# Patient Record
Sex: Female | Born: 1945 | Race: White | Hispanic: No | Marital: Married | Smoking: Never smoker
Health system: Southern US, Community
[De-identification: ages and names within clinical notes are randomized; demographics above are authoritative.]

## PROBLEM LIST (undated history)

## (undated) DIAGNOSIS — M8580 Other specified disorders of bone density and structure, unspecified site: Secondary | ICD-10-CM

## (undated) DIAGNOSIS — Z9889 Other specified postprocedural states: Secondary | ICD-10-CM

## (undated) DIAGNOSIS — S0300XA Dislocation of jaw, unspecified side, initial encounter: Secondary | ICD-10-CM

## (undated) DIAGNOSIS — R112 Nausea with vomiting, unspecified: Secondary | ICD-10-CM

## (undated) DIAGNOSIS — I10 Essential (primary) hypertension: Secondary | ICD-10-CM

## (undated) DIAGNOSIS — K5790 Diverticulosis of intestine, part unspecified, without perforation or abscess without bleeding: Secondary | ICD-10-CM

## (undated) DIAGNOSIS — K219 Gastro-esophageal reflux disease without esophagitis: Secondary | ICD-10-CM

## (undated) DIAGNOSIS — G629 Polyneuropathy, unspecified: Secondary | ICD-10-CM

## (undated) DIAGNOSIS — D126 Benign neoplasm of colon, unspecified: Secondary | ICD-10-CM

## (undated) DIAGNOSIS — H9190 Unspecified hearing loss, unspecified ear: Secondary | ICD-10-CM

## (undated) DIAGNOSIS — Z9289 Personal history of other medical treatment: Secondary | ICD-10-CM

## (undated) DIAGNOSIS — M5090 Cervical disc disorder, unspecified, unspecified cervical region: Secondary | ICD-10-CM

## (undated) DIAGNOSIS — F419 Anxiety disorder, unspecified: Secondary | ICD-10-CM

## (undated) DIAGNOSIS — E042 Nontoxic multinodular goiter: Secondary | ICD-10-CM

## (undated) DIAGNOSIS — I8390 Asymptomatic varicose veins of unspecified lower extremity: Secondary | ICD-10-CM

## (undated) DIAGNOSIS — N809 Endometriosis, unspecified: Secondary | ICD-10-CM

## (undated) DIAGNOSIS — C801 Malignant (primary) neoplasm, unspecified: Secondary | ICD-10-CM

## (undated) DIAGNOSIS — M1990 Unspecified osteoarthritis, unspecified site: Secondary | ICD-10-CM

## (undated) HISTORY — DX: Benign neoplasm of colon, unspecified: D12.6

## (undated) HISTORY — DX: Unspecified osteoarthritis, unspecified site: M19.90

## (undated) HISTORY — DX: Polyneuropathy, unspecified: G62.9

## (undated) HISTORY — PX: TONSILLECTOMY AND ADENOIDECTOMY: SHX28

## (undated) HISTORY — PX: CATARACT EXTRACTION: SUR2

## (undated) HISTORY — DX: Cervical disc disorder, unspecified, unspecified cervical region: M50.90

## (undated) HISTORY — DX: Gastro-esophageal reflux disease without esophagitis: K21.9

## (undated) HISTORY — DX: Diverticulosis of intestine, part unspecified, without perforation or abscess without bleeding: K57.90

## (undated) HISTORY — PX: EYE SURGERY: SHX253

## (undated) HISTORY — DX: Other specified disorders of bone density and structure, unspecified site: M85.80

## (undated) HISTORY — DX: Unspecified hearing loss, unspecified ear: H91.90

## (undated) HISTORY — DX: Endometriosis, unspecified: N80.9

## (undated) HISTORY — DX: Nontoxic multinodular goiter: E04.2

## (undated) HISTORY — PX: TONSILLECTOMY: SUR1361

## (undated) HISTORY — DX: Dislocation of jaw, unspecified side, initial encounter: S03.00XA

## (undated) HISTORY — DX: Essential (primary) hypertension: I10

---

## 1999-08-22 HISTORY — PX: HAND SURGERY: SHX662

## 2009-12-08 ENCOUNTER — Encounter: Admission: RE | Admit: 2009-12-08 | Discharge: 2009-12-08 | Payer: Self-pay | Admitting: Neurology

## 2010-09-11 ENCOUNTER — Encounter: Payer: Self-pay | Admitting: Neurology

## 2011-08-22 HISTORY — PX: COLONOSCOPY: SHX174

## 2012-03-21 HISTORY — PX: ESOPHAGOGASTRODUODENOSCOPY: SHX1529

## 2015-04-16 ENCOUNTER — Other Ambulatory Visit: Payer: Self-pay

## 2015-04-16 DIAGNOSIS — Z1231 Encounter for screening mammogram for malignant neoplasm of breast: Secondary | ICD-10-CM

## 2015-05-26 ENCOUNTER — Ambulatory Visit
Admission: RE | Admit: 2015-05-26 | Discharge: 2015-05-26 | Disposition: A | Discharge status: Home / Self Care | Payer: Medicare Other | Source: Ambulatory Visit

## 2015-05-26 DIAGNOSIS — Z1231 Encounter for screening mammogram for malignant neoplasm of breast: Secondary | ICD-10-CM

## 2015-11-03 DIAGNOSIS — K5790 Diverticulosis of intestine, part unspecified, without perforation or abscess without bleeding: Secondary | ICD-10-CM | POA: Insufficient documentation

## 2015-11-03 DIAGNOSIS — H9190 Unspecified hearing loss, unspecified ear: Secondary | ICD-10-CM | POA: Insufficient documentation

## 2015-11-03 DIAGNOSIS — M8580 Other specified disorders of bone density and structure, unspecified site: Secondary | ICD-10-CM | POA: Insufficient documentation

## 2015-11-03 DIAGNOSIS — E042 Nontoxic multinodular goiter: Secondary | ICD-10-CM | POA: Insufficient documentation

## 2015-11-03 DIAGNOSIS — N809 Endometriosis, unspecified: Secondary | ICD-10-CM | POA: Insufficient documentation

## 2015-11-03 DIAGNOSIS — R202 Paresthesia of skin: Secondary | ICD-10-CM | POA: Insufficient documentation

## 2015-11-03 DIAGNOSIS — M1990 Unspecified osteoarthritis, unspecified site: Secondary | ICD-10-CM | POA: Insufficient documentation

## 2015-11-03 DIAGNOSIS — I10 Essential (primary) hypertension: Secondary | ICD-10-CM | POA: Insufficient documentation

## 2015-11-03 DIAGNOSIS — D126 Benign neoplasm of colon, unspecified: Secondary | ICD-10-CM | POA: Insufficient documentation

## 2015-11-03 DIAGNOSIS — M5090 Cervical disc disorder, unspecified, unspecified cervical region: Secondary | ICD-10-CM | POA: Insufficient documentation

## 2015-11-08 DIAGNOSIS — G629 Polyneuropathy, unspecified: Secondary | ICD-10-CM | POA: Insufficient documentation

## 2016-06-19 ENCOUNTER — Other Ambulatory Visit: Payer: Self-pay | Admitting: Specialist

## 2016-06-19 DIAGNOSIS — Z1231 Encounter for screening mammogram for malignant neoplasm of breast: Secondary | ICD-10-CM

## 2016-07-12 ENCOUNTER — Ambulatory Visit
Admission: RE | Admit: 2016-07-12 | Discharge: 2016-07-12 | Disposition: A | Discharge status: Home / Self Care | Payer: Medicare Other | Source: Ambulatory Visit | Attending: Specialist | Admitting: Specialist

## 2016-07-12 DIAGNOSIS — Z1231 Encounter for screening mammogram for malignant neoplasm of breast: Secondary | ICD-10-CM

## 2016-07-25 ENCOUNTER — Encounter: Payer: Self-pay | Admitting: Neurology

## 2016-07-25 ENCOUNTER — Ambulatory Visit (INDEPENDENT_AMBULATORY_CARE_PROVIDER_SITE_OTHER): Payer: Medicare Other | Admitting: Neurology

## 2016-07-25 VITALS — BP 157/83 | HR 82 | Ht 65.0 in | Wt 158.0 lb

## 2016-07-25 DIAGNOSIS — R202 Paresthesia of skin: Secondary | ICD-10-CM | POA: Diagnosis not present

## 2016-07-25 DIAGNOSIS — M79605 Pain in left leg: Secondary | ICD-10-CM | POA: Diagnosis not present

## 2016-07-25 DIAGNOSIS — M79604 Pain in right leg: Secondary | ICD-10-CM | POA: Diagnosis not present

## 2016-07-25 DIAGNOSIS — G2581 Restless legs syndrome: Secondary | ICD-10-CM

## 2016-07-25 MED ORDER — OXYCODONE HCL ER 10 MG PO T12A: 10.0000 mg | EXTENDED_RELEASE_TABLET | Freq: Two times a day (BID) | ORAL | 0 refills | Status: DC

## 2016-07-25 NOTE — Patient Instructions (Addendum)
Tapering off ropinorole 1mg  three times a day.  1st week, one tab twice a day 2nd week: one tab every night Then stop   Keep on taking gabapentin 800mg  three times a day.  Take oxycodone ER 10mg  one tab before you go to bed.

## 2016-07-25 NOTE — Progress Notes (Signed)
PATIENT: Natalie Page DOB: Aug 23, 1945  Chief Complaint  Patient presents with  . Peripheral Neuropathy    Reports painful burning and tingling in her bilateral lower extremities.  She is currently taking gabapentin 800mg , TID.  Says this dose controlled her pain until six months ago when she noticed her symptoms getting worse.  [name] PCP    Natalie Quant, MD     [place] is a 70 years old left-handed female, seen in refer by her primary care doctor  Natalie Page for evaluation of paresthesia at her lower extremity, initial evaluation was on July 25 2016.  I reviewed and summarized the referring note, she had a history of hypertension,She was a patient of Dr. love in the past, last clinical visit was in 2011, she was evaluated then for bilateral lower extremity paresthesia,  She reported a history of gradual onset nagging discomfort involving bilateral lower extremity, top of her feet, bilateral calf muscles, initially at nighttime before she go to sleep, she felt restless, has the urge to move her leg, over the years, she was treated with Requip currently taking 1 mg 3 times a day, also titrating dose of gabapentin 800 mg 3 times a day, she complains of worsening symptoms since summer of 2017, whenever she sits still, even during the day, such as watching TV for a few hours, she felt discomfort at her anterior shin, top of her feet, urge to move her leg, worse times still at the nighttime before she go to sleep, it is hard for her to find a comfortable position,  She is hard of hearing, she denies bilateral fingertips paresthesia, denied gait abnormality, denies significant low back pain,  MRI of lumbar in 2011, multilevel degenerative disc disease, but there was no significant spinal canal or foraminal stenosis EMG nerve conduction study in 2010 showed evidence of absent right sural sensory response, but within normal limit left sural sensory  response.  Laboratory evaluation 2017, A1c was 5.6, normal TSH, normal liver functional tasks, total cholesterol 190, LDL 46, normal CBC with hemoglobin of 12.7,  REVIEW OF SYSTEMS: Full 14 system review of systems performed and notable only for  easy bruising, sleepiness, restless leg, flushing, joint pain, joint swelling, swelling in legs, hearing loss   ALLERGIES: Not on File  HOME MEDICATIONS: Current Outpatient Prescriptions  Medication Sig Dispense Refill  . hydroxychloroquine (PLAQUENIL) 200 MG tablet 2 (two) times daily.    [name] losartan (COZAAR) 50 MG tablet daily.    . nortriptyline (PAMELOR) 25 MG capsule at bedtime.    [name] omeprazole (PRILOSEC) 20 MG capsule daily.    [name] rOPINIRole (REQUIP) 1 MG tablet 3 (three) times daily.    . furosemide (LASIX) 20 MG tablet daily.    [name] gabapentin (NEURONTIN) 800 MG tablet 3 (three) times daily.    . hydrochlorothiazide (HYDRODIURIL) 25 MG tablet daily.     No current facility-administered medications for this visit.     PAST MEDICAL HISTORY: Past Medical History:  Diagnosis Date  . Adenomatous polyp of colon   . Cervical disc disorder   . Diverticular disease   . Endometriosis   . GERD (gastroesophageal reflux disease)   . Hypertension   . Multinodular goiter   . Osteoarthritis   . Osteopenia   . Partial deafness   . Peripheral neuropathy ([place])   . TMJ (dislocation of temporomandibular joint)     PAST SURGICAL HISTORY: Past Surgical History:  Procedure Laterality Date  .  CATARACT EXTRACTION Bilateral   . EYE SURGERY Right    eyelid  . HAND SURGERY Left   . TONSILLECTOMY AND ADENOIDECTOMY      FAMILY HISTORY: Family History  Problem Relation Age of Onset  . Stroke Mother   . COPD Father   . Cancer Father     Unsure of type - "on his chin"  . Throat cancer Sister   . Stomach cancer Brother   . Breast cancer Sister     SOCIAL HISTORY:  Social History   Social History  . Marital status: Married    Spouse  name: N/A  . Number of children: 0  . Years of education: 12   Occupational History  . Retired    Social History Main Topics  . Smoking status: Never Smoker  . Smokeless tobacco: Never Used  . Alcohol use Yes     Comment: 2 glasses three times weekly.  . Drug use: No  . Sexual activity: Not on file   Other Topics Concern  . Not on file   Social History Narrative   Lives at home with husband.   Left-handed.   4-5 cups caffeine daily.     PHYSICAL EXAM   Vitals:   07/25/16 1328  BP: (!) 157/83  Pulse: 82  Weight: 158 lb (71.7 kg)  Height: 5\' 5"  (1.651 m)    Not recorded      Body mass index is 26.29 kg/m.  PHYSICAL EXAMNIATION:  Gen: NAD, conversant, well nourised, obese, well groomed                     Cardiovascular: Regular rate rhythm, no peripheral edema, warm, nontender. Eyes: Conjunctivae clear without exudates or hemorrhage Neck: Supple, no carotid bruits. Pulmonary: Clear to auscultation bilaterally   NEUROLOGICAL EXAM:  MENTAL STATUS: Speech:    Speech is normal; fluent and spontaneous with normal comprehension.  Cognition:     Orientation to time, place and person     Normal recent and remote memory     Normal Attention span and concentration     Normal Language, naming, repeating,spontaneous speech     Fund of knowledge   CRANIAL NERVES: CN II: Visual fields are full to confrontation. Fundoscopic exam is normal with sharp discs and no vascular changes. Pupils are round equal and briskly reactive to light. CN III, IV, VI: extraocular movement are normal. No ptosis. CN V: Facial sensation is intact to pinprick in all 3 divisions bilaterally. Corneal responses are intact.  CN VII: Face is symmetric with normal eye closure and smile. CN VIII: Hard of hearing bilaterally  CN IX, X: Palate elevates symmetrically. Phonation is normal. CN XI: Head turning and shoulder shrug are intact CN XII: Tongue is midline with normal movements and no  atrophy.  MOTOR: There is no pronator drift of out-stretched arms. Muscle bulk and tone are normal. Muscle strength is normal.  REFLEXES: Reflexes ar 1 and symmetric at the biceps, triceps, knees, and ankles. Plantar responses are flexor.  SENSORY: Intact to light touch, pinprick, positional sensation and vibratory sensation are intact in fingers and toes.  COORDINATION: Rapid alternating movements and fine finger movements are intact. There is no dysmetria on finger-to-nose and heel-knee-shin.    GAIT/STANCE: Posture is normal. Gait is steady with normal steps, base, arm swing, and turning. Heel and toe walking are normal. Tandem gait is normal.  Romberg is absent.   DIAGNOSTIC DATA (LABS, IMAGING, TESTING) - I reviewed patient  records, labs, notes, testing and imaging myself where available.   ASSESSMENT AND [place] is a 70 y.o. female   Restless leg syndrome   likely she has developed augmentation with prolonged Requip treatment,  I have suggested her tapering off Requip  Keep current dose of gabapentin 800 mg 3 times a day,  add on oxycodone ER 10 mg every night  Bilateral lower extremity paresthesia, suggestive of peripheral neuropathy   complete evaluation with EMG nerve conduction study  Laboratory evaluation for treatable etiology     Marcial Pacas, M.D. Ph.D.  [facility] [address] [place], [place] [address] Ph: [phone] Fax: [phone]  CC: Natalie Quant, MD

## 2016-07-26 DIAGNOSIS — E663 Overweight: Secondary | ICD-10-CM | POA: Insufficient documentation

## 2016-07-27 LAB — FOLATE

## 2016-07-27 LAB — IMMUNOFIXATION ELECTROPHORESIS
IGG (IMMUNOGLOBIN G), SERUM: 734 mg/dL (ref 700–1600)
IgA/Immunoglobulin A, Serum: 135 mg/dL (ref 64–422)
IgM (Immunoglobulin M), Srm: 73 mg/dL (ref 26–217)
TOTAL PROTEIN: 6.8 g/dL (ref 6.0–8.5)

## 2016-07-27 LAB — VITAMIN B12

## 2016-07-27 LAB — ANA W/REFLEX IF POSITIVE: Anti Nuclear Antibody(ANA): NEGATIVE

## 2016-07-27 LAB — IRON AND TIBC
Iron Saturation: 18 % (ref 15–55)
Iron: 59 ug/dL (ref 27–139)
TIBC: 332 ug/dL (ref 250–450)
UIBC: 273 ug/dL (ref 118–369)

## 2016-07-27 LAB — VITAMIN D 25 HYDROXY (VIT D DEFICIENCY, FRACTURES): Vit D, 25-Hydroxy: 77.3 ng/mL (ref 30.0–100.0)

## 2016-07-27 LAB — SEDIMENTATION RATE: Sed Rate: 2 mm/hr (ref 0–40)

## 2016-07-27 LAB — RPR: RPR: NONREACTIVE

## 2016-07-27 LAB — C-REACTIVE PROTEIN: CRP: 2.3 mg/L (ref 0.0–4.9)

## 2016-07-27 LAB — FERRITIN: FERRITIN: 68 ng/mL (ref 15–150)

## 2016-07-27 LAB — CK: CK TOTAL: 141 U/L (ref 24–173)

## 2016-08-07 ENCOUNTER — Telehealth: Payer: Self-pay | Admitting: Neurology

## 2016-08-07 NOTE — Telephone Encounter (Signed)
Spoke to patient - she is still tapering off Requip and will taking her last dose today.  The addition of oxycodone has been especially helpful for her symptoms.  She has been able to sleep at night.  However, she is having difficulty with constipation.  She will increase her fluid intake, be attentive to a higher fiber diet, exercise and start an OTC stool softener.  If these suggestions are not helpful, she has been instructed to call us back.

## 2016-08-07 NOTE — Telephone Encounter (Signed)
Pt says oxyCODONE (OXYCONTIN) 10 mg 12 hr tablet is causing constipation. Wants to try something else. pls call

## 2016-08-07 NOTE — Telephone Encounter (Signed)
Left message for a return call

## 2016-09-06 ENCOUNTER — Ambulatory Visit: Payer: Medicare Other | Admitting: Adult Health

## 2016-09-12 ENCOUNTER — Ambulatory Visit (INDEPENDENT_AMBULATORY_CARE_PROVIDER_SITE_OTHER): Payer: Medicare Other | Admitting: Orthopaedic Surgery

## 2016-09-15 ENCOUNTER — Encounter: Payer: Medicare Other | Admitting: Neurology

## 2016-09-21 ENCOUNTER — Ambulatory Visit: Payer: Medicare Other | Admitting: Adult Health

## 2016-10-06 ENCOUNTER — Ambulatory Visit (INDEPENDENT_AMBULATORY_CARE_PROVIDER_SITE_OTHER): Payer: Medicare Other | Admitting: Neurology

## 2016-10-06 DIAGNOSIS — M79605 Pain in left leg: Secondary | ICD-10-CM | POA: Diagnosis not present

## 2016-10-06 DIAGNOSIS — G2581 Restless legs syndrome: Secondary | ICD-10-CM | POA: Diagnosis not present

## 2016-10-06 DIAGNOSIS — M79604 Pain in right leg: Secondary | ICD-10-CM

## 2016-10-06 DIAGNOSIS — R202 Paresthesia of skin: Secondary | ICD-10-CM | POA: Diagnosis not present

## 2016-10-06 MED ORDER — GABAPENTIN 800 MG PO TABS: 800.0000 mg | ORAL_TABLET | Freq: Three times a day (TID) | ORAL | 4 refills | Status: AC

## 2016-10-06 NOTE — Procedures (Signed)
Full Name: Natalie Page Gender: Female MRN #: [id number] Date of Birth: 08-29-1945    Visit Date: 10/06/2016 11:04 Age: 71 Years 2 Months Old Examining Physician: Marcial Pacas, MD  Referring Physician: Krista Blue History: 71 years old female, complains of bilateral lower extremity paresthesia, mainly involving top of her feet extending to lateral leg.  Summary of test:   Nerve conduction study: Bilateral sural sensory responses were within normal limits. Bilateral superficial peroneal sensory responses were absent.  Bilateral peroneal to EDB and tibial motor responses were normal. Bilateral tibial H reflexes were present and symmetric.  Electromyography:  Selective needle examinations of bilateral lower extremity muscles and bilateral lumbar sacral paraspinal muscles were performed. There was no significant abnormality noticed.   Conclusion: This is a slight abnormal study. There is no electrodiagnostic evidence of large fiber peripheral neuropathy of bilateral lumbar sacral radiculopathy. The absent bilateral superficial peroneal sensory responses could due to technical difficulties.    ------------------------------- Marcial Pacas, M.D.  [facility] [place], [place] [address] Tel: [phone] Fax: [phone]        [facility]    Nerve / Sites Rec. Site Peak Lat Ref. Amp.1-2 Ref. Distance    ms ms V V cm  L Sural - Ankle (Calf)     Calf Ankle 3.91 ?4.40 5.3 ?6.0 14  R Sural - Ankle (Calf)     Calf Ankle 3.70 ?4.40 7.0 ?6.0 14  L Superficial peroneal - Ankle     Lat leg Ankle NR ?4.40 NR ?6.0 14  R Superficial peroneal - Ankle     Lat leg Ankle NR ?4.40 NR ?6.0 14     [facility]    Nerve / Sites Muscle Latency Ref. Amplitude Ref. Rel Amp Segments Distance Lat Diff Velocity Ref. Area    ms ms mV mV %  cm ms m/s m/s mVms  L Peroneal - EDB     Ankle EDB 4.7 ?6.5 8.0 ?2.0 100 Ankle - EDB 9    23.3     Fib head EDB 10.8  7.0  87.7 Fib head - Ankle  27 6.1 44 ?44 22.0     Pop fossa EDB 13.1  6.9  98.9 Pop fossa - Fib head 10 2.2 45 ?44 21.9         Pop fossa - Ankle  8.3     R Peroneal - EDB     Ankle EDB 4.7 ?6.5 6.3 ?2.0 100 Ankle - EDB 9    20.3     Fib head EDB 10.8  5.7  89.8 Fib head - Ankle 27 6.1 44 ?44 20.2     Pop fossa EDB 13.1  5.9  103 Pop fossa - Fib head 10 2.2 45 ?44 21.1         Pop fossa - Ankle  8.4     L Tibial - AH     Ankle AH 4.7 ?5.8 9.4 ?4.0 100 Ankle - AH 9    16.5     Pop fossa AH 13.7  6.8  72.1 Pop fossa - Ankle 38 9.0 42 ?41 16.2  R Tibial - AH     Ankle AH 4.4 ?5.8 9.3 ?4.0 100 Ankle - AH 9    20.6     Pop fossa AH 14.0  5.9  63.3 Pop fossa - Ankle 39 9.6 41 ?41 16.5     F  Wave    Nerve F Lat Ref.  ms ms  L Tibial - AH 58.8 ?56.0  R Tibial - AH 56.8 ?56.0     EMG full       EMG Summary Table    Spontaneous MUAP Recruitment  Muscle IA Fib PSW Fasc Other Amp Dur. Poly Pattern  L. Tibialis anterior Normal None None None _______ Normal Normal Normal Normal  L. Gastrocnemius (Medial head) Normal None None None _______ Normal Normal Normal Normal  L. Vastus lateralis Normal None None None _______ Normal Normal Normal Normal  R. Tibialis anterior Normal None None None _______ Normal Normal Normal Normal  R. Gastrocnemius (Medial head) Normal None None None _______ Normal Normal Normal Normal  R. Vastus lateralis Normal None None None _______ Normal Normal Normal Normal  R. Lumbar paraspinals (mid) Normal None None None _______ Normal Normal Normal Normal  R. Lumbar paraspinals (low) Normal None None None _______ Normal Normal Normal Normal  L. Lumbar paraspinals (mid) Normal None None None _______ Normal Normal Normal Normal  L. Lumbar paraspinals (low) Normal None None None _______ Normal Normal Normal Normal

## 2016-10-06 NOTE — Progress Notes (Signed)
PATIENT: Natalie Page DOB: 1946-07-23  No chief complaint on file.    HISTORICAL  Natalie Page is a 71 years old left-handed female, seen in refer by her primary care doctor  Natalie Page for evaluation of paresthesia at her lower extremity, initial evaluation was on July 25 2016.  I reviewed and summarized the referring note, she had a history of hypertension,She was a patient of Natalie Page in the past, last clinical visit was in 2011, she was evaluated then for bilateral lower extremity paresthesia,  She reported a history of gradual onset nagging discomfort involving bilateral lower extremity, top of her feet, bilateral calf muscles, initially at nighttime before she go to sleep, she felt restless, has the urge to move her leg, over the years, she was treated with Requip currently taking 1 mg 3 times a day, also titrating dose of gabapentin 800 mg 3 times a day, she complains of worsening symptoms since summer of 2017, whenever she sits still, even during the day, such as watching TV for a few hours, she felt discomfort at her anterior shin, top of her feet, urge to move her leg, worse times still at the nighttime before she go to sleep, it is hard for her to find a comfortable position,  She is hard of hearing, she denies bilateral fingertips paresthesia, denied gait abnormality, denies significant low back pain,  MRI of lumbar in 2011, multilevel degenerative disc disease, but there was no significant spinal canal or foraminal stenosis EMG nerve conduction study in 2010 showed evidence of absent right sural sensory response, but within normal limit left sural sensory response.  Laboratory evaluation 2017, A1c was 5.6, normal TSH, normal liver functional tasks, total cholesterol 190, LDL 46, normal CBC with hemoglobin of 12.7,  UPDATE Oct 06 2016: She has tapered off Requip 1 mg 3 times a day, there is no significant improvement or worsening of her restless leg symptoms,  she has tried oxycodone ER 10 mg every night, she seems to sleep better, but could not tolerate associated constipation, rather not taking it, she has been taking gabapentin 800 mg 3 times a day for few years, because bilateral feet paresthesia, urge to move, last dose was at 5 PM, she also takes nortriptyline 25 mg every night before she go to sleep.  She returned for electrodiagnostic study today, there was no evidence of large fiber peripheral neuropathy, or bilateral lumbar radiculopathy.  I reviewed laboratory evaluation in November 2017, normal vitamin D 77, ferritin 68, vitamin B12 more than 2000, normal iron level, folic acid, negative RPR.   REVIEW OF SYSTEMS: Full 14 system review of systems performed and notable only for  easy bruising, sleepiness, restless leg, flushing, joint pain, joint swelling, swelling in legs, hearing loss   ALLERGIES: Not on File  HOME MEDICATIONS: Current Outpatient Prescriptions  Medication Sig Dispense Refill  . acetaminophen (TYLENOL) 650 MG CR tablet Take 650 mg by mouth as needed.    . Calcium Citrate-Vitamin D (CALCIUM + D PO) Take by mouth. Takes 1200mg  - 1000 IU tablet daily.    . Cholecalciferol (VITAMIN D3) 5000 units TABS Take by mouth daily.    . furosemide (LASIX) 20 MG tablet daily.    [name] gabapentin (NEURONTIN) 800 MG tablet 3 (three) times daily.    . hydrochlorothiazide (HYDRODIURIL) 25 MG tablet daily.    . hydroxychloroquine (PLAQUENIL) 200 MG tablet 2 (two) times daily.    [name] losartan (COZAAR) 50 MG tablet daily.    [name]  naproxen sodium (ANAPROX) 220 MG tablet Take 440 mg by mouth daily.    . nortriptyline (PAMELOR) 25 MG capsule at bedtime.    [name] omeprazole (PRILOSEC) 20 MG capsule daily.    [name] oxyCODONE (OXYCONTIN) 10 mg 12 hr tablet Take 1 tablet (10 mg total) by mouth every 12 (twelve) hours. Every night before you go to bed 60 tablet 0  . rOPINIRole (REQUIP) 1 MG tablet 3 (three) times daily.    [name] UNABLE TO FIND Med Name: Juice +  [employer]end supplement daily.     No current facility-administered medications for this visit.     PAST MEDICAL HISTORY: Past Medical History:  Diagnosis Date  . Adenomatous polyp of colon   . Cervical disc disorder   . Diverticular disease   . Endometriosis   . GERD (gastroesophageal reflux disease)   . Hypertension   . Multinodular goiter   . Osteoarthritis   . Osteopenia   . Partial deafness   . Peripheral neuropathy ([place])   . TMJ (dislocation of temporomandibular joint)     PAST SURGICAL HISTORY: Past Surgical History:  Procedure Laterality Date  . CATARACT EXTRACTION Bilateral   . EYE SURGERY Right    eyelid  . HAND SURGERY Left   . TONSILLECTOMY AND ADENOIDECTOMY      FAMILY HISTORY: Family History  Problem Relation Age of Onset  . Stroke Mother   . COPD Father   . Cancer Father     Unsure of type - "on his chin"  . Throat cancer Sister   . Stomach cancer Brother   . Breast cancer Sister     SOCIAL HISTORY:  Social History   Social History  . Marital status: Married    Spouse name: N/A  . Number of children: 0  . Years of education: 12   Occupational History  . Retired    Social History Main Topics  . Smoking status: Never Smoker  . Smokeless tobacco: Never Used  . Alcohol use Yes     Comment: 2 glasses three times weekly.  . Drug use: No  . Sexual activity: Not on file   Other Topics Concern  . Not on file   Social History Narrative   Lives at home with husband.   Left-handed.   4-5 cups caffeine daily.     PHYSICAL EXAM   There were no vitals filed for this visit.  Not recorded      There is no height or weight on file to calculate BMI.  PHYSICAL EXAMNIATION:  Gen: NAD, conversant, well nourised, obese, well groomed                     Cardiovascular: Regular rate rhythm, no peripheral edema, warm, nontender. Eyes: Conjunctivae clear without exudates or hemorrhage Neck: Supple, no carotid bruits. Pulmonary: Clear to  auscultation bilaterally   NEUROLOGICAL EXAM:  MENTAL STATUS: Speech:    Speech is normal; fluent and spontaneous with normal comprehension.  Cognition:     Orientation to time, place and person     Normal recent and remote memory     Normal Attention span and concentration     Normal Language, naming, repeating,spontaneous speech     Fund of knowledge   CRANIAL NERVES: CN II: Visual fields are full to confrontation. Fundoscopic exam is normal with sharp discs and no vascular changes. Pupils are round equal and briskly reactive to light. CN III, IV, VI: extraocular movement are normal. No ptosis.  CN V: Facial sensation is intact to pinprick in all 3 divisions bilaterally. Corneal responses are intact.  CN VII: Face is symmetric with normal eye closure and smile. CN VIII: Hard of hearing bilaterally  CN IX, X: Palate elevates symmetrically. Phonation is normal. CN XI: Head turning and shoulder shrug are intact CN XII: Tongue is midline with normal movements and no atrophy.  MOTOR: There is no pronator drift of out-stretched arms. Muscle bulk and tone are normal. Muscle strength is normal.  REFLEXES: Reflexes ar 1 and symmetric at the biceps, triceps, knees, and ankles. Plantar responses are flexor.  SENSORY: Intact to light touch, pinprick, positional sensation and vibratory sensation are intact in fingers and toes.  COORDINATION: Rapid alternating movements and fine finger movements are intact. There is no dysmetria on finger-to-nose and heel-knee-shin.    GAIT/STANCE: Posture is normal. Gait is steady with normal steps, base, arm swing, and turning. Heel and toe walking are normal. Tandem gait is normal.  Romberg is absent.   DIAGNOSTIC DATA (LABS, IMAGING, TESTING) - I reviewed patient records, labs, notes, testing and imaging myself where available.   ASSESSMENT AND Natalie [place] is a 71 y.o. female   Restless leg syndrome   She was able to taper off Requip  without worsening of her symptoms.   Could not tolerate oxycodone ER 10 mg due to intolerable constipation  Keep current dose of gabapentin 800 mg 3 times a day, moved the last dose to before bedtime  Bilateral lower extremity paresthesia,  No evidence of large fiber peripheral neuropathy  Laboratory evaluation does not show any etiology     Marcial Pacas, M.D. Ph.D.  [facility] [address] [place], [place] [address] Ph: [phone] Fax: [phone]  CC: [name], MD

## 2016-10-12 ENCOUNTER — Ambulatory Visit: Payer: Medicare Other | Admitting: Adult Health

## 2016-11-21 ENCOUNTER — Ambulatory Visit: Payer: Self-pay | Admitting: Orthopedic Surgery

## 2016-11-22 ENCOUNTER — Other Ambulatory Visit: Payer: Self-pay | Admitting: Orthopedic Surgery

## 2016-11-28 ENCOUNTER — Ambulatory Visit: Payer: Self-pay | Admitting: Orthopedic Surgery

## 2016-11-28 NOTE — H&P (Signed)
TOTAL KNEE ADMISSION H&P  Patient is being admitted for left total knee arthroplasty.  Subjective:  Chief Complaint:left knee pain.  HPI: [name], 71 y.o. female, has a history of pain and functional disability in the left knee due to arthritis and has failed non-surgical conservative treatments for greater than 12 weeks to includeNSAID's and/or analgesics, corticosteriod injections, flexibility and strengthening excercises, use of assistive devices, weight reduction as appropriate and activity modification.  Onset of symptoms was abrupt, starting 1 years ago with rapidlly worsening course since that time. The patient noted no past surgery on the left knee(s).  Patient currently rates pain in the left knee(s) at 10 out of 10 with activity. Patient has night pain, worsening of pain with activity and weight bearing, pain that interferes with activities of daily living, pain with passive range of motion, crepitus and joint swelling.  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 There is no active infection.  Patient Active Problem List   Diagnosis Date Noted  . Restless leg syndrome 07/25/2016  . Pain in both lower extremities 07/25/2016  . Paresthesia 07/25/2016   Past Medical History:  Diagnosis Date  . Adenomatous polyp of colon   . Cervical disc disorder   . Diverticular disease   . Endometriosis   . GERD (gastroesophageal reflux disease)   . Hypertension   . Multinodular goiter   . Osteoarthritis   . Osteopenia   . Partial deafness   . Peripheral neuropathy ([place])   . TMJ (dislocation of temporomandibular joint)     Past Surgical History:  Procedure Laterality Date  . CATARACT EXTRACTION Bilateral   . EYE SURGERY Right    eyelid  . HAND SURGERY Left   . TONSILLECTOMY AND ADENOIDECTOMY       (Not in a hospital admission) Not on File  Social History  Substance Use  Topics  . Smoking status: Never Smoker  . Smokeless tobacco: Never Used  . Alcohol use Yes     Comment: 2 glasses three times weekly.    Family History  Problem Relation Age of Onset  . Stroke Mother   . COPD Father   . Cancer Father     Unsure of type - "on his chin"  . Throat cancer Sister   . Stomach cancer Brother   . Breast cancer Sister      Review of Systems  Constitutional: Negative.   HENT: Positive for hearing loss.   Eyes: Negative.   Cardiovascular: Negative.   Gastrointestinal: Negative.   Genitourinary: Negative.   Musculoskeletal: Positive for joint pain.  Skin: Negative.   Neurological: Negative.   Endo/Heme/Allergies: Negative.   Psychiatric/Behavioral: Negative.     Objective:  Physical Exam  Vitals reviewed. Constitutional: She is oriented to person, place, and time. She appears well-developed and well-nourished.  HENT:  Head: Normocephalic and atraumatic.  Eyes: Conjunctivae and EOM are normal. Pupils are equal, round, and reactive to light.  Neck: Neck supple.  Cardiovascular: Normal rate, regular rhythm and intact distal pulses.   Respiratory: Effort normal. No respiratory distress.  GI: Soft. She exhibits no distension.  Genitourinary:  Genitourinary Comments: deferred  Musculoskeletal:       Left knee: She exhibits decreased range of motion, swelling, effusion and abnormal alignment. Tenderness found. Medial joint line tenderness noted.  Neurological: She is alert and oriented to person, place, and time. She has normal reflexes.  Skin: Skin is warm.  Psychiatric: She has a normal mood and affect. Her behavior is normal. Judgment and thought content normal.    Vital signs in last 24 hours: @VSRANGES @  Labs:   Estimated body mass index is 26.29 kg/m as calculated from the following:   Height as of 07/25/16: 5\' 5"  (1.651 m).   Weight as of 07/25/16: 71.7 kg (158 lb).   Imaging Review Plain radiographs demonstrate severe degenerative  joint disease of the left knee(s). The overall alignment issignificant varus. The bone quality appears to be adequate for age and reported activity level.  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 The treatment options including medical management, injection therapy arthroscopy and arthroplasty were discussed at length. The risks and benefits of total knee arthroplasty were presented and reviewed. The risks due to aseptic loosening, infection, stiffness, patella tracking problems, thromboembolic complications and other imponderables were discussed. The patient acknowledged the explanation, agreed to proceed with the plan and consent was signed. Patient is being admitted for inpatient treatment for surgery, pain control, PT, OT, prophylactic antibiotics, VTE prophylaxis, progressive ambulation and ADL's and discharge planning. The patient is planning to be discharged home with outpatient PT.

## 2016-12-07 ENCOUNTER — Encounter (HOSPITAL_COMMUNITY)
Admission: RE | Admit: 2016-12-07 | Discharge: 2016-12-07 | Disposition: A | Discharge status: Home / Self Care | Payer: Medicare Other | Source: Ambulatory Visit | Attending: Orthopedic Surgery | Admitting: Orthopedic Surgery

## 2016-12-07 ENCOUNTER — Encounter (HOSPITAL_COMMUNITY): Payer: Self-pay

## 2016-12-07 DIAGNOSIS — M8580 Other specified disorders of bone density and structure, unspecified site: Secondary | ICD-10-CM | POA: Diagnosis not present

## 2016-12-07 DIAGNOSIS — Z803 Family history of malignant neoplasm of breast: Secondary | ICD-10-CM | POA: Insufficient documentation

## 2016-12-07 DIAGNOSIS — Z01812 Encounter for preprocedural laboratory examination: Secondary | ICD-10-CM | POA: Diagnosis present

## 2016-12-07 DIAGNOSIS — Z823 Family history of stroke: Secondary | ICD-10-CM | POA: Diagnosis not present

## 2016-12-07 DIAGNOSIS — I10 Essential (primary) hypertension: Secondary | ICD-10-CM | POA: Insufficient documentation

## 2016-12-07 DIAGNOSIS — K219 Gastro-esophageal reflux disease without esophagitis: Secondary | ICD-10-CM | POA: Insufficient documentation

## 2016-12-07 DIAGNOSIS — Z9889 Other specified postprocedural states: Secondary | ICD-10-CM | POA: Insufficient documentation

## 2016-12-07 DIAGNOSIS — M1712 Unilateral primary osteoarthritis, left knee: Secondary | ICD-10-CM | POA: Insufficient documentation

## 2016-12-07 DIAGNOSIS — Z808 Family history of malignant neoplasm of other organs or systems: Secondary | ICD-10-CM | POA: Diagnosis not present

## 2016-12-07 DIAGNOSIS — Z809 Family history of malignant neoplasm, unspecified: Secondary | ICD-10-CM | POA: Diagnosis not present

## 2016-12-07 HISTORY — DX: Malignant (primary) neoplasm, unspecified: C80.1

## 2016-12-07 HISTORY — DX: Asymptomatic varicose veins of unspecified lower extremity: I83.90

## 2016-12-07 HISTORY — DX: Personal history of other medical treatment: Z92.89

## 2016-12-07 LAB — TYPE AND SCREEN
ABO/RH(D): O POS
Antibody Screen: NEGATIVE

## 2016-12-07 LAB — BASIC METABOLIC PANEL
ANION GAP: 8 (ref 5–15)
BUN: 12 mg/dL (ref 6–20)
CALCIUM: 9.5 mg/dL (ref 8.9–10.3)
CO2: 29 mmol/L (ref 22–32)
Chloride: 97 mmol/L — ABNORMAL LOW (ref 101–111)
Creatinine, Ser: 0.72 mg/dL (ref 0.44–1.00)
GFR calc Af Amer: >60 mL/min (ref >60)
GLUCOSE: 111 mg/dL — AB (ref 65–99)
Potassium: 3.3 mmol/L — ABNORMAL LOW (ref 3.5–5.1)
SODIUM: 134 mmol/L — AB (ref 135–145)

## 2016-12-07 LAB — CBC
HCT: 38.2 % (ref 36.0–46.0)
HEMOGLOBIN: 12.3 g/dL (ref 12.0–15.0)
MCH: 27.6 pg (ref 26.0–34.0)
MCHC: 32.2 g/dL (ref 30.0–36.0)
MCV: 85.8 fL (ref 78.0–100.0)
Platelets: 233 10*3/uL (ref 150–400)
RBC: 4.45 MIL/uL (ref 3.87–5.11)
RDW: 14 % (ref 11.5–15.5)
WBC: 5.4 10*3/uL (ref 4.0–10.5)

## 2016-12-07 LAB — SURGICAL PCR SCREEN
MRSA, PCR: NEGATIVE
STAPHYLOCOCCUS AUREUS: NEGATIVE

## 2016-12-07 LAB — ABO/RH: ABO/RH(D): O POS

## 2016-12-07 NOTE — Pre-Procedure Instructions (Signed)
Natalie Page  12/07/2016      [facility] [place], [address] AT [address] [place] [address] Phone: [phone] Fax: [phone]    Your procedure is scheduled on 12/18/2016  Report to [facility] Admitting at 5:30 A.M.  Call this number if you have problems the morning of surgery:  [phone]   Remember:  Do not eat food or drink liquids after midnight.   Take these medicines the morning of surgery with A SIP OF WATER: Prilosec (omeprazole), Tylenol, Gabapentin   Do not wear jewelry, make-up or nail polish.   Do not wear lotions, powders, or perfumes, or deoderant.   Do not shave 48 hours prior to surgery.     Do not bring valuables to the hospital.   [facility] is not responsible for any belongings or valuables.  Contacts, dentures or bridgework may not be worn into surgery.  Leave your suitcase in the car.  After surgery it may be brought to your room.  For patients admitted to the hospital, discharge time will be determined by your treatment team.  Patients discharged the day of surgery will not be allowed to drive home.   Name and phone number of your driver:   With family  Special instructions:  Special Instructions:  - Preparing for Surgery  Before surgery, you can play an important role.  Because skin is not sterile, your skin needs to be as free of germs as possible.  You can reduce the number of germs on you skin by washing with CHG (chlorahexidine gluconate) soap before surgery.  CHG is an antiseptic cleaner which kills germs and bonds with the skin to continue killing germs even after washing.  Please DO NOT use if you have an allergy to CHG or antibacterial soaps.  If your skin becomes reddened/irritated stop using the CHG and inform your nurse when you arrive at Short Stay.  Do not shave (including legs and underarms) for at least 48 hours prior  to the first CHG shower.  You may shave your face.  Please follow these instructions carefully:   1.  Shower with CHG Soap the night before surgery and the  morning of Surgery.  2.  If you choose to wash your hair, wash your hair first as usual with your  normal shampoo.  3.  After you shampoo, rinse your hair and body thoroughly to remove the  Shampoo.  4.  Use CHG as you would any other liquid soap.  You can apply chg directly to the skin and wash gently with scrungie or a clean washcloth.  5.  Apply the CHG Soap to your body ONLY FROM THE NECK DOWN.    Do not use on open wounds or open sores.  Avoid contact with your eyes, ears, mouth and genitals (private parts).  Wash genitals (private parts)   with your normal soap.  6.  Wash thoroughly, paying special attention to the area where your surgery will be performed.  7.  Thoroughly rinse your body with warm water from the neck down.  8.  DO NOT shower/wash with your normal soap after using and rinsing off   the CHG Soap.  9.  Pat yourself dry with a clean towel.            10.  Wear clean pajamas.  11.  Place clean sheets on your bed the night of your first shower and do not sleep with pets.  Day of Surgery  Do not apply any lotions/deodorants the morning of surgery.  Please wear clean clothes to the hospital/surgery center.  Please read over the following fact sheets that you were given. Pain Booklet, Coughing and Deep Breathing, MRSA Information and Surgical Site Infection Prevention

## 2016-12-07 NOTE — Progress Notes (Signed)
Pt. Denies all chest complaints, followed by PCP- Pomposini in [place], who has not referred her to cardiology for anything in the past. Pt. Reports that she had a stress test in the PCP office many yrs. Ago, told that it was normal.

## 2016-12-15 MED ORDER — TRANEXAMIC ACID 1000 MG/10ML IV SOLN
1000.0000 mg | INTRAVENOUS | Status: AC
  Administered 2016-12-18: 1000 mg via INTRAVENOUS
  Filled 2016-12-15: qty 10

## 2016-12-15 MED ORDER — SODIUM CHLORIDE 0.9 % IV SOLN: INTRAVENOUS | Status: DC

## 2016-12-15 MED ORDER — CEFAZOLIN SODIUM-DEXTROSE 2-4 GM/100ML-% IV SOLN
2.0000 g | INTRAVENOUS | Status: AC
  Administered 2016-12-18: 2 g via INTRAVENOUS
  Filled 2016-12-15: qty 100

## 2016-12-15 MED ORDER — ACETAMINOPHEN 10 MG/ML IV SOLN
1000.0000 mg | INTRAVENOUS | Status: AC
  Administered 2016-12-18: 1000 mg via INTRAVENOUS
  Filled 2016-12-15: qty 100

## 2016-12-18 ENCOUNTER — Encounter (HOSPITAL_COMMUNITY)
Admission: RE | Disposition: A | Discharge status: Home / Self Care | Payer: Self-pay | Source: Ambulatory Visit | Attending: Orthopedic Surgery

## 2016-12-18 ENCOUNTER — Inpatient Hospital Stay (HOSPITAL_COMMUNITY): Payer: Medicare Other | Admitting: Certified Registered Nurse Anesthetist

## 2016-12-18 ENCOUNTER — Encounter (HOSPITAL_COMMUNITY): Payer: Self-pay | Admitting: Certified Registered Nurse Anesthetist

## 2016-12-18 ENCOUNTER — Inpatient Hospital Stay (HOSPITAL_COMMUNITY): Payer: Medicare Other

## 2016-12-18 ENCOUNTER — Inpatient Hospital Stay (HOSPITAL_COMMUNITY)
Admission: RE | Admit: 2016-12-18 | Discharge: 2016-12-20 | DRG: 470 | Disposition: A | Discharge status: Home / Self Care | Payer: Medicare Other | Source: Ambulatory Visit | Attending: Orthopedic Surgery | Admitting: Orthopedic Surgery

## 2016-12-18 DIAGNOSIS — M1712 Unilateral primary osteoarthritis, left knee: Secondary | ICD-10-CM

## 2016-12-18 DIAGNOSIS — Z09 Encounter for follow-up examination after completed treatment for conditions other than malignant neoplasm: Secondary | ICD-10-CM

## 2016-12-18 DIAGNOSIS — I10 Essential (primary) hypertension: Secondary | ICD-10-CM | POA: Diagnosis present

## 2016-12-18 DIAGNOSIS — K219 Gastro-esophageal reflux disease without esophagitis: Secondary | ICD-10-CM | POA: Diagnosis present

## 2016-12-18 DIAGNOSIS — G629 Polyneuropathy, unspecified: Secondary | ICD-10-CM | POA: Diagnosis present

## 2016-12-18 DIAGNOSIS — E042 Nontoxic multinodular goiter: Secondary | ICD-10-CM | POA: Diagnosis present

## 2016-12-18 DIAGNOSIS — G2581 Restless legs syndrome: Secondary | ICD-10-CM | POA: Diagnosis present

## 2016-12-18 DIAGNOSIS — H9190 Unspecified hearing loss, unspecified ear: Secondary | ICD-10-CM | POA: Diagnosis present

## 2016-12-18 HISTORY — PX: KNEE ARTHROPLASTY: SHX992

## 2016-12-18 SURGERY — ARTHROPLASTY, KNEE, TOTAL, USING IMAGELESS COMPUTER-ASSISTED NAVIGATION
Anesthesia: Spinal | Site: Knee | Laterality: Left

## 2016-12-18 MED ORDER — HYDROMORPHONE HCL 1 MG/ML IJ SOLN
0.5000 mg | INTRAMUSCULAR | Status: DC | PRN
Start: 2016-12-18 — End: 2016-12-20

## 2016-12-18 MED ORDER — MIDAZOLAM HCL 2 MG/2ML IJ SOLN
INTRAMUSCULAR | Status: AC
  Filled 2016-12-18: qty 2

## 2016-12-18 MED ORDER — TRANEXAMIC ACID 1000 MG/10ML IV SOLN
1000.0000 mg | Freq: Once | INTRAVENOUS | Status: AC
  Administered 2016-12-18: 1000 mg via INTRAVENOUS
  Filled 2016-12-18: qty 10

## 2016-12-18 MED ORDER — METHOCARBAMOL 500 MG PO TABS
500.0000 mg | ORAL_TABLET | Freq: Four times a day (QID) | ORAL | Status: DC | PRN
  Administered 2016-12-18 – 2016-12-20 (×3): 500 mg via ORAL
  Filled 2016-12-18 (×3): qty 1

## 2016-12-18 MED ORDER — MENTHOL 3 MG MT LOZG
1.0000 | LOZENGE | OROMUCOSAL | Status: DC | PRN
Start: 2016-12-18 — End: 2016-12-20

## 2016-12-18 MED ORDER — BUPIVACAINE IN DEXTROSE 0.75-8.25 % IT SOLN
INTRATHECAL | Status: DC | PRN
  Administered 2016-12-18: 15 mg via INTRATHECAL

## 2016-12-18 MED ORDER — FENTANYL CITRATE (PF) 100 MCG/2ML IJ SOLN
INTRAMUSCULAR | Status: AC
  Administered 2016-12-18: 100 ug
  Filled 2016-12-18: qty 2

## 2016-12-18 MED ORDER — METOCLOPRAMIDE HCL 5 MG/ML IJ SOLN: 5.0000 mg | Freq: Three times a day (TID) | INTRAMUSCULAR | Status: DC | PRN

## 2016-12-18 MED ORDER — BUPIVACAINE-EPINEPHRINE (PF) 0.5% -1:200000 IJ SOLN
INTRAMUSCULAR | Status: DC | PRN
  Administered 2016-12-18: 30 mL

## 2016-12-18 MED ORDER — ASPIRIN 81 MG PO CHEW
81.0000 mg | CHEWABLE_TABLET | Freq: Two times a day (BID) | ORAL | Status: DC
  Administered 2016-12-18 – 2016-12-20 (×4): 81 mg via ORAL
  Filled 2016-12-18 (×4): qty 1

## 2016-12-18 MED ORDER — KETOROLAC TROMETHAMINE 30 MG/ML IJ SOLN
INTRAMUSCULAR | Status: AC
  Filled 2016-12-18: qty 1

## 2016-12-18 MED ORDER — PHENYLEPHRINE HCL 10 MG/ML IJ SOLN
INTRAMUSCULAR | Status: DC | PRN
  Administered 2016-12-18: 40 ug/min via INTRAVENOUS

## 2016-12-18 MED ORDER — ACETAMINOPHEN 650 MG RE SUPP
650.0000 mg | Freq: Four times a day (QID) | RECTAL | Status: DC | PRN
Start: 2016-12-18 — End: 2016-12-20

## 2016-12-18 MED ORDER — HYDROCHLOROTHIAZIDE 25 MG PO TABS
25.0000 mg | ORAL_TABLET | Freq: Every day | ORAL | Status: DC
  Administered 2016-12-19 – 2016-12-20 (×2): 25 mg via ORAL
  Filled 2016-12-18 (×2): qty 1

## 2016-12-18 MED ORDER — LACTATED RINGERS IV SOLN
INTRAVENOUS | Status: DC
  Administered 2016-12-18 (×2): via INTRAVENOUS

## 2016-12-18 MED ORDER — ONDANSETRON HCL 4 MG/2ML IJ SOLN
4.0000 mg | Freq: Four times a day (QID) | INTRAMUSCULAR | Status: DC | PRN
  Administered 2016-12-19: 4 mg via INTRAVENOUS
  Filled 2016-12-18: qty 2

## 2016-12-18 MED ORDER — KETOROLAC TROMETHAMINE 15 MG/ML IJ SOLN
7.5000 mg | Freq: Four times a day (QID) | INTRAMUSCULAR | Status: AC
  Administered 2016-12-18 – 2016-12-19 (×4): 7.5 mg via INTRAVENOUS
  Filled 2016-12-18 (×4): qty 1

## 2016-12-18 MED ORDER — FENTANYL CITRATE (PF) 250 MCG/5ML IJ SOLN
INTRAMUSCULAR | Status: AC
  Filled 2016-12-18: qty 5

## 2016-12-18 MED ORDER — CEFAZOLIN SODIUM-DEXTROSE 1-4 GM/50ML-% IV SOLN
1.0000 g | Freq: Four times a day (QID) | INTRAVENOUS | Status: AC
  Administered 2016-12-18 (×2): 1 g via INTRAVENOUS
  Filled 2016-12-18 (×2): qty 50

## 2016-12-18 MED ORDER — SODIUM CHLORIDE 0.9 % IJ SOLN
INTRAMUSCULAR | Status: DC | PRN
  Administered 2016-12-18: 30 mL

## 2016-12-18 MED ORDER — ROPIVACAINE HCL 5 MG/ML IJ SOLN
INTRAMUSCULAR | Status: DC | PRN
  Administered 2016-12-18: 25 mL via PERINEURAL

## 2016-12-18 MED ORDER — SODIUM CHLORIDE 0.9 % IR SOLN
Status: DC | PRN
Start: 2016-12-18 — End: 2016-12-18
  Administered 2016-12-18: 3000 mL
  Administered 2016-12-18: 1000 mL

## 2016-12-18 MED ORDER — DIPHENHYDRAMINE HCL 12.5 MG/5ML PO ELIX: 12.5000 mg | ORAL_SOLUTION | ORAL | Status: DC | PRN

## 2016-12-18 MED ORDER — DOCUSATE SODIUM 100 MG PO CAPS
100.0000 mg | ORAL_CAPSULE | Freq: Two times a day (BID) | ORAL | Status: DC
  Administered 2016-12-18 – 2016-12-20 (×4): 100 mg via ORAL
  Filled 2016-12-18 (×4): qty 1

## 2016-12-18 MED ORDER — LOSARTAN POTASSIUM 50 MG PO TABS
50.0000 mg | ORAL_TABLET | Freq: Every day | ORAL | Status: DC
  Administered 2016-12-19 – 2016-12-20 (×2): 50 mg via ORAL
  Filled 2016-12-18 (×2): qty 1

## 2016-12-18 MED ORDER — KETOROLAC TROMETHAMINE 30 MG/ML IJ SOLN
INTRAMUSCULAR | Status: DC | PRN
  Administered 2016-12-18: 30 mg

## 2016-12-18 MED ORDER — FUROSEMIDE 20 MG PO TABS
20.0000 mg | ORAL_TABLET | Freq: Every day | ORAL | Status: DC
  Administered 2016-12-19 – 2016-12-20 (×2): 20 mg via ORAL
  Filled 2016-12-18 (×2): qty 1

## 2016-12-18 MED ORDER — DEXAMETHASONE SODIUM PHOSPHATE 10 MG/ML IJ SOLN
10.0000 mg | Freq: Once | INTRAMUSCULAR | Status: AC
  Administered 2016-12-19: 10 mg via INTRAVENOUS
  Filled 2016-12-18: qty 1

## 2016-12-18 MED ORDER — PROPOFOL 10 MG/ML IV BOLUS
INTRAVENOUS | Status: DC | PRN
  Administered 2016-12-18: 20 mg via INTRAVENOUS

## 2016-12-18 MED ORDER — METOCLOPRAMIDE HCL 5 MG PO TABS: 5.0000 mg | ORAL_TABLET | Freq: Three times a day (TID) | ORAL | Status: DC | PRN

## 2016-12-18 MED ORDER — MAGNESIUM OXIDE 400 (241.3 MG) MG PO TABS
400.0000 mg | ORAL_TABLET | Freq: Every evening | ORAL | Status: DC
Start: 2016-12-18 — End: 2016-12-20
  Administered 2016-12-18 – 2016-12-19 (×2): 400 mg via ORAL
  Filled 2016-12-18 (×2): qty 1

## 2016-12-18 MED ORDER — POVIDONE-IODINE 10 % EX SWAB: 2.0000 "application " | Freq: Once | CUTANEOUS | Status: DC

## 2016-12-18 MED ORDER — POLYETHYLENE GLYCOL 3350 17 G PO PACK: 17.0000 g | PACK | Freq: Every day | ORAL | Status: DC | PRN

## 2016-12-18 MED ORDER — ONDANSETRON HCL 4 MG PO TABS: 4.0000 mg | ORAL_TABLET | Freq: Four times a day (QID) | ORAL | Status: DC | PRN

## 2016-12-18 MED ORDER — SODIUM CHLORIDE 0.9 % IV SOLN
INTRAVENOUS | Status: DC
  Administered 2016-12-18: 17:00:00 via INTRAVENOUS

## 2016-12-18 MED ORDER — FENTANYL CITRATE (PF) 100 MCG/2ML IJ SOLN: 25.0000 ug | INTRAMUSCULAR | Status: DC | PRN

## 2016-12-18 MED ORDER — NORTRIPTYLINE HCL 25 MG PO CAPS
25.0000 mg | ORAL_CAPSULE | Freq: Every day | ORAL | Status: DC
  Administered 2016-12-18 – 2016-12-19 (×2): 25 mg via ORAL
  Filled 2016-12-18 (×2): qty 1

## 2016-12-18 MED ORDER — CHLORHEXIDINE GLUCONATE 4 % EX LIQD: 60.0000 mL | Freq: Once | CUTANEOUS | Status: DC

## 2016-12-18 MED ORDER — SENNA 8.6 MG PO TABS
2.0000 | ORAL_TABLET | Freq: Every day | ORAL | Status: DC
  Administered 2016-12-18 – 2016-12-19 (×2): 17.2 mg via ORAL
  Filled 2016-12-18 (×2): qty 2

## 2016-12-18 MED ORDER — 0.9 % SODIUM CHLORIDE (POUR BTL) OPTIME
TOPICAL | Status: DC | PRN
  Administered 2016-12-18: 1000 mL

## 2016-12-18 MED ORDER — GABAPENTIN 800 MG PO TABS
800.0000 mg | ORAL_TABLET | Freq: Three times a day (TID) | ORAL | Status: DC
  Filled 2016-12-18 (×2): qty 1

## 2016-12-18 MED ORDER — HYDROCODONE-ACETAMINOPHEN 5-325 MG PO TABS
1.0000 | ORAL_TABLET | ORAL | Status: DC | PRN
  Administered 2016-12-18 – 2016-12-20 (×5): 2 via ORAL
  Filled 2016-12-18 (×5): qty 2

## 2016-12-18 MED ORDER — ALUM & MAG HYDROXIDE-SIMETH 200-200-20 MG/5ML PO SUSP: 30.0000 mL | ORAL | Status: DC | PRN

## 2016-12-18 MED ORDER — PANTOPRAZOLE SODIUM 40 MG PO TBEC
40.0000 mg | DELAYED_RELEASE_TABLET | Freq: Every day | ORAL | Status: DC
  Administered 2016-12-19 – 2016-12-20 (×2): 40 mg via ORAL
  Filled 2016-12-18 (×2): qty 1

## 2016-12-18 MED ORDER — VITAMIN B-12 1000 MCG PO TABS
5000.0000 ug | ORAL_TABLET | Freq: Every day | ORAL | Status: DC
  Administered 2016-12-19 – 2016-12-20 (×2): 5000 ug via ORAL
  Filled 2016-12-18 (×2): qty 5

## 2016-12-18 MED ORDER — GABAPENTIN 400 MG PO CAPS
800.0000 mg | ORAL_CAPSULE | Freq: Three times a day (TID) | ORAL | Status: DC
  Administered 2016-12-18 – 2016-12-20 (×6): 800 mg via ORAL
  Filled 2016-12-18 (×6): qty 2

## 2016-12-18 MED ORDER — PHENOL 1.4 % MT LIQD: 1.0000 | OROMUCOSAL | Status: DC | PRN

## 2016-12-18 MED ORDER — ACETAMINOPHEN 325 MG PO TABS
650.0000 mg | ORAL_TABLET | Freq: Four times a day (QID) | ORAL | Status: DC | PRN
  Administered 2016-12-19: 650 mg via ORAL
  Filled 2016-12-18: qty 2

## 2016-12-18 MED ORDER — PROPOFOL 500 MG/50ML IV EMUL
INTRAVENOUS | Status: DC | PRN
  Administered 2016-12-18: 80 ug/kg/min via INTRAVENOUS

## 2016-12-18 MED ORDER — BUPIVACAINE-EPINEPHRINE (PF) 0.5% -1:200000 IJ SOLN
INTRAMUSCULAR | Status: AC
  Filled 2016-12-18: qty 30

## 2016-12-18 MED ORDER — DEXTROSE 5 % IV SOLN
500.0000 mg | Freq: Four times a day (QID) | INTRAVENOUS | Status: DC | PRN
  Filled 2016-12-18: qty 5

## 2016-12-18 SURGICAL SUPPLY — 42 items
ALCOHOL ISOPROPYL (RUBBING) (MISCELLANEOUS) ×3 IMPLANT
BANDAGE ACE 6X5 VEL STRL LF (GAUZE/BANDAGES/DRESSINGS) ×3 IMPLANT
BLADE SAW RECIP 87.9 MT (BLADE) ×3 IMPLANT
CAPT KNEE TRIATH TK-4 ×3 IMPLANT
CHLORAPREP W/TINT 26ML (MISCELLANEOUS) ×6 IMPLANT
CUFF TOURNIQUET SINGLE 34IN LL (TOURNIQUET CUFF) ×3 IMPLANT
DERMABOND ADVANCED (GAUZE/BANDAGES/DRESSINGS) ×2
DERMABOND ADVANCED .7 DNX12 (GAUZE/BANDAGES/DRESSINGS) ×1 IMPLANT
DRAIN HEMOVAC 7FR (DRAIN) ×3 IMPLANT
DRAPE SHEET LG 3/4 BI-LAMINATE (DRAPES) ×6 IMPLANT
DRAPE U-SHAPE 47X51 STRL (DRAPES) ×3 IMPLANT
DRAPE UNIVERSAL PACK (DRAPES) ×3 IMPLANT
DRSG AQUACEL AG ADV 3.5X14 (GAUZE/BANDAGES/DRESSINGS) ×3 IMPLANT
DRSG TEGADERM 2-3/8X2-3/4 SM (GAUZE/BANDAGES/DRESSINGS) ×3 IMPLANT
ELECT REM PT RETURN 9FT ADLT (ELECTROSURGICAL) ×3
ELECTRODE REM PT RTRN 9FT ADLT (ELECTROSURGICAL) ×1 IMPLANT
EVACUATOR 1/8 PVC DRAIN (DRAIN) IMPLANT
GLOVE BIO SURGEON STRL SZ8.5 (GLOVE) ×6 IMPLANT
GLOVE BIOGEL PI IND STRL 8.5 (GLOVE) ×1 IMPLANT
GLOVE BIOGEL PI INDICATOR 8.5 (GLOVE) ×2
GOWN STRL REUS W/TWL 2XL LVL3 (GOWN DISPOSABLE) ×3 IMPLANT
HANDPIECE INTERPULSE COAX TIP (DISPOSABLE) ×2
KIT BASIN OR (CUSTOM PROCEDURE TRAY) ×3 IMPLANT
MANIFOLD NEPTUNE II (INSTRUMENTS) ×3 IMPLANT
NEEDLE SPNL 18GX3.5 QUINCKE PK (NEEDLE) ×6 IMPLANT
PACK TOTAL JOINT (CUSTOM PROCEDURE TRAY) ×3 IMPLANT
PACK TOTAL KNEE CUSTOM (KITS) ×3 IMPLANT
PADDING CAST COTTON 6X4 STRL (CAST SUPPLIES) ×3 IMPLANT
SAW OSC TIP CART 19.5X105X1.3 (SAW) ×3 IMPLANT
SEALER BIPOLAR AQUA 6.0 (INSTRUMENTS) ×3 IMPLANT
SET HNDPC FAN SPRY TIP SCT (DISPOSABLE) ×1 IMPLANT
SET PAD KNEE POSITIONER (MISCELLANEOUS) ×3 IMPLANT
SUT MNCRL AB 3-0 PS2 27 (SUTURE) ×3 IMPLANT
SUT MON AB 2-0 CT1 36 (SUTURE) ×6 IMPLANT
SUT VIC AB 1 CTX 27 (SUTURE) ×3 IMPLANT
SUT VIC AB 2-0 CT1 27 (SUTURE) ×2
SUT VIC AB 2-0 CT1 TAPERPNT 27 (SUTURE) ×1 IMPLANT
SUT VLOC 180 0 24IN GS25 (SUTURE) ×3 IMPLANT
SYR 50ML LL SCALE MARK (SYRINGE) ×6 IMPLANT
TOWER CARTRIDGE SMART MIX (DISPOSABLE) ×3 IMPLANT
TRAY CATH 16FR W/PLASTIC CATH (SET/KITS/TRAYS/PACK) IMPLANT
WRAP KNEE MAXI GEL POST OP (GAUZE/BANDAGES/DRESSINGS) ×3 IMPLANT

## 2016-12-18 NOTE — Op Note (Signed)
OPERATIVE REPORT  SURGEON: Rod Can, MD   ASSISTANT: Ky Barban, RNFA  PREOPERATIVE DIAGNOSIS: Left knee arthritis.   POSTOPERATIVE DIAGNOSIS: Left knee arthritis.   PROCEDURE: Left total knee arthroplasty.   IMPLANTS: Stryker Triathlon CR femur, size 5. Stryker Tritanium tibia, size 5. X3 polyethelyene insert, size 9 mm, CS. 3 button asymmetric patella, size 35 mm.  ANESTHESIA:  Spinal  TOURNIQUET TIME: Not utilized.   ESTIMATED BLOOD LOSS: 250 mL.  ANTIBIOTICS: 2 g Ancef.  DRAINS: None.  COMPLICATIONS: None   CONDITION: PACU - hemodynamically stable.   BRIEF CLINICAL NOTE: Natalie Page is a 71 y.o. female with a long-standing history of Left knee arthritis secondary to a medial femoral condyle osteochondral defect. After failing conservative management, the patient was indicated for total knee arthroplasty. The risks, benefits, and alternatives to the procedure were explained, and the patient elected to proceed.  PROCEDURE IN DETAIL: Adductor canal block was obtained in the pre-op holding area. Once inside the operative room, spinal anesthesia was obtained, and a foley catheter was inserted. The patient was then positioned, a nonsterile tourniquet was placed, and the lower extremity was prepped and draped in the normal sterile surgical fashion. A time-out was called verifying side and site of surgery. The patient received IV antibiotics within 60 minutes of beginning the procedure. The tourniquet was not utilized.  An anterior approach to the knee was performed utilizing a midvastus arthrotomy. A medial release was performed and the patellar fat pad was excised. Stryker navigation was used to cut the distal femur perpendicular to the mechanical axis. A freehand patellar resection was performed, and the patella was sized an prepared with 3 lug holes.  Nagivation was  used to make a neutral proximal tibia resection, taking 8 mm of bone from the less affected lateral side with 3 degrees of slope. The menisci were excised. A spacer block was placed, and the alignment and balance in extension were confirmed.   The distal femur was sized using the 3-degree external rotation guide referencing the posterior femoral cortex. The appropriate 4-in-1 cutting block was pinned into place. Rotation was checked using Whiteside's line, the epicondylar axis, and then confirmed with a spacer block in flexion. The remaining femoral cuts were performed, taking care to protect the MCL.  The tibia was sized and the trial tray was pinned into place. The remaining trail components were inserted. The knee was stable to varus and valgus stress through a full range of motion. The patella tracked centrally, and the PCL was well balanced. The trial components were removed, and the proximal tibial surface was prepared. Final components were impacted into place. The knee was tested for a final time and found to be well balanced.  The wound was copiously irrigated with a dilute betadine solution followed by normal saline with pulse lavage. Marcaine solution was injected into the periarticular soft tissue. The wound was closed in layers using #1 Vicryl and Stratafix for the fascia, 2-0 Vicryl for the subcutaneous fat, 2-0 Monocryl for the deep dermal layer, 3-0 running Monocryl subcuticular Stitch, and Dermabond for the skin. Once the glue was fully dried, an Aquacell Ag and compressive dressing were applied. Tthe patient was transported to the recovery room in stable condition. Sponge, needle, and instrument counts were correct at the end of the case x2. The patient tolerated the procedure well and there were no known complications.

## 2016-12-18 NOTE — Interval H&P Note (Signed)
History and Physical Interval Note:  12/18/2016 9:35 AM  Natalie Page  has presented today for surgery, with the diagnosis of Degenerative joint disease left knee  The various methods of treatment have been discussed with the patient and family. After consideration of risks, benefits and other options for treatment, the patient has consented to  Procedure(s) with comments: LEFT TOTAL KNEE ARTHROPLASTY WITH COMPUTER NAVIGATION (Left) - Dr. request RNFA as a surgical intervention .  The patient's history has been reviewed, patient examined, no change in status, stable for surgery.  I have reviewed the patient's chart and labs.  Questions were answered to the patient's satisfaction.     Sahand Gosch, Horald Pollen

## 2016-12-18 NOTE — Care Management Note (Signed)
Case Management Note  Patient Details  Name: Natalie Page MRN: [id number] Date of Birth: 13-Jul-1946  Subjective/Objective:         Spoke with patient and spouse at the bedside. Patient has DME RW and BSC at home pta. Patient has f/u apt w OP PT Friday at 2:00.           Action/Plan:  CM will continue to follow for additional needs Expected Discharge Date:                  Expected Discharge Plan:  Home/Self Care  In-House Referral:     Discharge planning Services  CM Consult  Post Acute Care Choice:    Choice offered to:     DME Arranged:    DME Agency:     HH Arranged:    HH Agency:     Status of Service:  In process, will continue to follow  If discussed at Long Length of Stay Meetings, dates discussed:    Additional Comments:  Carles Collet, RN 12/18/2016, 4:39 PM

## 2016-12-18 NOTE — Anesthesia Procedure Notes (Signed)
Spinal  Patient location during procedure: OR Start time: 12/18/2016 10:57 AM End time: 12/18/2016 11:02 AM Staffing Anesthesiologist: Roderic Palau Performed: anesthesiologist  Preanesthetic Checklist Completed: patient identified, surgical consent, pre-op evaluation, timeout performed, IV checked, risks and benefits discussed and monitors and equipment checked Spinal Block Patient position: sitting Prep: DuraPrep Patient monitoring: cardiac monitor, continuous pulse ox and blood pressure Approach: midline Location: L3-4 Injection technique: single-shot Needle Needle type: Pencan  Needle gauge: 24 G Needle length: 9 cm Assessment Sensory level: T8 Additional Notes Functioning IV was confirmed and monitors were applied. Sterile prep and drape, including hand hygiene and sterile gloves were used. The patient was positioned and the spine was prepped. The skin was anesthetized with lidocaine.  Free flow of clear CSF was obtained prior to injecting local anesthetic into the CSF.  The spinal needle aspirated freely following injection.  The needle was carefully withdrawn.  The patient tolerated the procedure well.

## 2016-12-18 NOTE — Discharge Instructions (Signed)
° °Dr. Nickia Boesen °Total Joint Specialist °[facility] °[address] °[place], [place] [address] °[phone] ° °TOTAL KNEE REPLACEMENT POSTOPERATIVE DIRECTIONS ° ° ° °Knee Rehabilitation, Guidelines Following Surgery  °Results after knee surgery are often greatly improved when you follow the exercise, range of motion and muscle strengthening exercises prescribed by your doctor. Safety measures are also important to protect the knee from further injury. Any time any of these exercises cause you to have increased pain or swelling in your knee joint, decrease the amount until you are comfortable again and slowly increase them. If you have problems or questions, call your caregiver or physical therapist for advice.  ° °WEIGHT BEARING °Weight bearing as tolerated with assist device (walker, cane, etc) as directed, use it as long as suggested by your surgeon or therapist, typically at least 4-6 weeks. ° °HOME CARE INSTRUCTIONS  °Remove items at home which could result in a fall. This includes throw rugs or furniture in walking pathways.  °Continue medications as instructed at time of discharge. °You may have some home medications which will be placed on hold until you complete the course of blood thinner medication.  °You may start showering once you are discharged home but do not submerge the incision under water. Just pat the incision dry and apply a dry gauze dressing on daily. °Walk with walker as instructed.  °You may resume a sexual relationship in one month or when given the OK by your doctor.  °· Use walker as long as suggested by your caregivers. °· Avoid periods of inactivity such as sitting longer than an hour when not asleep. This helps prevent blood clots.  °You may put full weight on your legs and walk as much as is comfortable.  °You may return to work once you are cleared by your doctor.  °Do not drive a car for 6 weeks or until released by you surgeon.  °· Do not drive  while taking narcotics.  °Wear the elastic stockings for three weeks following surgery during the day but you may remove then at night. °Make sure you keep all of your appointments after your operation with all of your doctors and caregivers. You should call the office at the above phone number and make an appointment for approximately two weeks after the date of your surgery. °Do not remove your surgical dressing. The dressing is waterproof; you may take showers in 3 days, but do not take tub baths or submerge the dressing. °Please pick up a stool softener and laxative for home use as long as you are requiring pain medications. °· ICE to the affected knee every three hours for 30 minutes at a time and then as needed for pain and swelling.  Continue to use ice on the knee for pain and swelling from surgery. You may notice swelling that will progress down to the foot and ankle.  This is normal after surgery.  Elevate the leg when you are not up walking on it.   °It is important for you to complete the blood thinner medication as prescribed by your doctor. °· Continue to use the breathing machine which will help keep your temperature down.  It is common for your temperature to cycle up and down following surgery, especially at night when you are not up moving around and exerting yourself.  The breathing machine keeps your lungs expanded and your temperature down. ° °RANGE OF MOTION AND STRENGTHENING EXERCISES  °Rehabilitation of the knee is important following   a knee injury or an operation. After just a few days of immobilization, the muscles of the thigh which control the knee become weakened and shrink (atrophy). Knee exercises are designed to build up the tone and strength of the thigh muscles and to improve knee motion. Often times heat used for twenty to thirty minutes before working out will loosen up your tissues and help with improving the range of motion but do not use heat for the first two weeks following  surgery. These exercises can be done on a training (exercise) mat, on the floor, on a table or on a bed. Use what ever works the best and is most comfortable for you Knee exercises include:  °Leg Lifts - While your knee is still immobilized in a splint or cast, you can do straight leg raises. Lift the leg to 60 degrees, hold for 3 sec, and slowly lower the leg. Repeat 10-20 times 2-3 times daily. Perform this exercise against resistance later as your knee gets better.  °Quad and Hamstring Sets - Tighten up the muscle on the front of the thigh (Quad) and hold for 5-10 sec. Repeat this 10-20 times hourly. Hamstring sets are done by pushing the foot backward against an object and holding for 5-10 sec. Repeat as with quad sets.  °A rehabilitation program following serious knee injuries can speed recovery and prevent re-injury in the future due to weakened muscles. Contact your doctor or a physical therapist for more information on knee rehabilitation.  ° °SKILLED REHAB INSTRUCTIONS: °If the patient is transferred to a skilled rehab facility following release from the hospital, a list of the current medications will be sent to the facility for the patient to continue.  When discharged from the skilled rehab facility, please have the facility set up the patient's Home Health Physical Therapy prior to being released. Also, the skilled facility will be responsible for providing the patient with their medications at time of release from the facility to include their pain medication, the muscle relaxants, and their blood thinner medication. If the patient is still at the rehab facility at time of the two week follow up appointment, the skilled rehab facility will also need to assist the patient in arranging follow up appointment in our office and any transportation needs. ° °MAKE SURE YOU:  °Understand these instructions.  °Will watch your condition.  °Will get help right away if you are not doing well or get worse.   ° ° °Pick up stool softner and laxative for home use following surgery while on pain medications. °Do NOT remove your dressing. You may shower.  °Do not take tub baths or submerge incision under water. °May shower starting three days after surgery. °Please use a clean towel to pat the incision dry following showers. °Continue to use ice for pain and swelling after surgery. °Do not use any lotions or creams on the incision until instructed by your surgeon. ° °

## 2016-12-18 NOTE — Anesthesia Procedure Notes (Signed)
Anesthesia Regional Block: Adductor canal block   Pre-Anesthetic Checklist: ,, timeout performed, Correct Patient, Correct Site, Correct Laterality, Correct Procedure, Correct Position, site marked, Risks and benefits discussed, pre-op evaluation,  At surgeon's request and post-op pain management  Laterality: Left  Prep: Maximum Sterile Barrier Precautions used, chloraprep       Needles:  Injection technique: Single-shot  Needle Type: Echogenic Stimulator Needle     Needle Length: 9cm  Needle Gauge: 21     Additional Needles:   Procedures: ultrasound guided,,,,,,,,  Narrative:  Start time: 12/18/2016 9:38 AM End time: 12/18/2016 9:48 AM Injection made incrementally with aspirations every 5 mL. Anesthesiologist: Roderic Palau  Additional Notes: 2% Lidocaine skin wheel.

## 2016-12-18 NOTE — H&P (View-Only) (Signed)
TOTAL KNEE ADMISSION H&P  Patient is being admitted for left total knee arthroplasty.  Subjective:  Chief Complaint:left knee pain.  HPI: Natalie Page, 71 y.o. female, has a history of pain and functional disability in the left knee due to arthritis and has failed non-surgical conservative treatments for greater than 12 weeks to includeNSAID's and/or analgesics, corticosteriod injections, flexibility and strengthening excercises, use of assistive devices, weight reduction as appropriate and activity modification.  Onset of symptoms was abrupt, starting 1 years ago with rapidlly worsening course since that time. The patient noted no past surgery on the left knee(s).  Patient currently rates pain in the left knee(s) at 10 out of 10 with activity. Patient has night pain, worsening of pain with activity and weight bearing, pain that interferes with activities of daily living, pain with passive range of motion, crepitus and joint swelling.  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 There is no active infection.  Patient Active Problem List   Diagnosis Date Noted  . Restless leg syndrome 07/25/2016  . Pain in both lower extremities 07/25/2016  . Paresthesia 07/25/2016   Past Medical History:  Diagnosis Date  . Adenomatous polyp of colon   . Cervical disc disorder   . Diverticular disease   . Endometriosis   . GERD (gastroesophageal reflux disease)   . Hypertension   . Multinodular goiter   . Osteoarthritis   . Osteopenia   . Partial deafness   . Peripheral neuropathy ([place])   . TMJ (dislocation of temporomandibular joint)     Past Surgical History:  Procedure Laterality Date  . CATARACT EXTRACTION Bilateral   . EYE SURGERY Right    eyelid  . HAND SURGERY Left   . TONSILLECTOMY AND ADENOIDECTOMY       (Not in a hospital admission) Not on File  Social History  Substance Use  Topics  . Smoking status: Never Smoker  . Smokeless tobacco: Never Used  . Alcohol use Yes     Comment: 2 glasses three times weekly.    Family History  Problem Relation Age of Onset  . Stroke Mother   . COPD Father   . Cancer Father     Unsure of type - "on his chin"  . Throat cancer Sister   . Stomach cancer Brother   . Breast cancer Sister      Review of Systems  Constitutional: Negative.   HENT: Positive for hearing loss.   Eyes: Negative.   Cardiovascular: Negative.   Gastrointestinal: Negative.   Genitourinary: Negative.   Musculoskeletal: Positive for joint pain.  Skin: Negative.   Neurological: Negative.   Endo/Heme/Allergies: Negative.   Psychiatric/Behavioral: Negative.     Objective:  Physical Exam  Vitals reviewed. Constitutional: She is oriented to person, place, and time. She appears well-developed and well-nourished.  HENT:  Head: Normocephalic and atraumatic.  Eyes: Conjunctivae and EOM are normal. Pupils are equal, round, and reactive to light.  Neck: Neck supple.  Cardiovascular: Normal rate, regular rhythm and intact distal pulses.   Respiratory: Effort normal. No respiratory distress.  GI: Soft. She exhibits no distension.  Genitourinary:  Genitourinary Comments: deferred  Musculoskeletal:       Left knee: She exhibits decreased range of motion, swelling, effusion and abnormal alignment. Tenderness found. Medial joint line tenderness noted.  Neurological: She is alert and oriented to person, place, and time. She has normal reflexes.  Skin: Skin is warm.  Psychiatric: She has a normal mood and affect. Her behavior is normal. Judgment and thought content normal.    Vital signs in last 24 hours: @VSRANGES @  Labs:   Estimated body mass index is 26.29 kg/m as calculated from the following:   Height as of 07/25/16: 5\' 5"  (1.651 m).   Weight as of 07/25/16: 71.7 kg (158 lb).   Imaging Review Plain radiographs demonstrate severe degenerative  joint disease of the left knee(s). The overall alignment issignificant varus. The bone quality appears to be adequate for age and reported activity level.  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 The treatment options including medical management, injection therapy arthroscopy and arthroplasty were discussed at length. The risks and benefits of total knee arthroplasty were presented and reviewed. The risks due to aseptic loosening, infection, stiffness, patella tracking problems, thromboembolic complications and other imponderables were discussed. The patient acknowledged the explanation, agreed to proceed with the plan and consent was signed. Patient is being admitted for inpatient treatment for surgery, pain control, PT, OT, prophylactic antibiotics, VTE prophylaxis, progressive ambulation and ADL's and discharge planning. The patient is planning to be discharged home with outpatient PT.

## 2016-12-18 NOTE — Transfer of Care (Signed)
Immediate Anesthesia Transfer of Care Note  Patient: Natalie Page  Procedure(s) Performed: Procedure(s) with comments: LEFT TOTAL KNEE ARTHROPLASTY WITH COMPUTER NAVIGATION (Left) - Dr. request RNFA  Patient Location: PACU  Anesthesia Type:MAC combined with regional for post-op pain  Level of Consciousness: awake, alert , oriented and patient cooperative  Airway & Oxygen Therapy: Patient Spontanous Breathing and Patient connected to nasal cannula oxygen  Post-op Assessment: Report given to RN and Post -op Vital signs reviewed and stable  Post vital signs: Reviewed and stable  Last Vitals:  Vitals:   12/18/16 0808 12/18/16 1315  BP: (!) 186/89 (!) 135/96  Pulse: 79 83  Resp: 20 14  Temp: 36.7 C 36.5 C    Last Pain:  Vitals:   12/18/16 0808  TempSrc: Oral      Patients Stated Pain Goal: 2 (85/02/77 4128)  Complications: No apparent anesthesia complications

## 2016-12-18 NOTE — Anesthesia Preprocedure Evaluation (Addendum)
Anesthesia Evaluation  Patient identified by MRN, date of birth, ID band Patient awake    Reviewed: Allergy & Precautions, H&P , NPO status , Patient's Chart, lab work & pertinent test results  Airway Mallampati: III  TM Distance: >3 FB Neck ROM: Full    Dental no notable dental hx. (+) Teeth Intact, Dental Advisory Given   Pulmonary neg pulmonary ROS,    Pulmonary exam normal breath sounds clear to auscultation       Cardiovascular hypertension, Pt. on medications + Peripheral Vascular Disease   Rhythm:Regular Rate:Normal     Neuro/Psych negative neurological ROS  negative psych ROS   GI/Hepatic Neg liver ROS, GERD  Controlled and Medicated,  Endo/Other  negative endocrine ROS  Renal/GU negative Renal ROS  negative genitourinary   Musculoskeletal  (+) Arthritis , Osteoarthritis,    Abdominal   Peds  Hematology negative hematology ROS (+)   Anesthesia Other Findings   Reproductive/Obstetrics negative OB ROS                            Anesthesia Physical Anesthesia Plan  ASA: II  Anesthesia Plan: Spinal   Post-op Pain Management:  Regional for Post-op pain   Induction: Intravenous  Airway Management Planned: Simple Face Mask  Additional Equipment:   Intra-op Plan:   Post-operative Plan:   Informed Consent: I have reviewed the patients History and Physical, chart, labs and discussed the procedure including the risks, benefits and alternatives for the proposed anesthesia with the patient or authorized representative who has indicated his/her understanding and acceptance.   Dental advisory given  Plan Discussed with: CRNA  Anesthesia Plan Comments:         Anesthesia Quick Evaluation

## 2016-12-18 NOTE — Anesthesia Procedure Notes (Signed)
Procedure Name: MAC Date/Time: 12/18/2016 11:02 AM Performed by: Salli Quarry Jymir Dunaj Pre-anesthesia Checklist: Patient identified, Emergency Drugs available, Suction available and Patient being monitored Patient Re-evaluated:Patient Re-evaluated prior to inductionOxygen Delivery Method: Simple face mask

## 2016-12-19 ENCOUNTER — Encounter (HOSPITAL_COMMUNITY): Payer: Self-pay | Admitting: Orthopedic Surgery

## 2016-12-19 LAB — BASIC METABOLIC PANEL
Anion gap: 5 (ref 5–15)
BUN: 5 mg/dL — AB (ref 6–20)
CO2: 26 mmol/L (ref 22–32)
CREATININE: 0.55 mg/dL (ref 0.44–1.00)
Calcium: 8.7 mg/dL — ABNORMAL LOW (ref 8.9–10.3)
Chloride: 105 mmol/L (ref 101–111)
Glucose, Bld: 115 mg/dL — ABNORMAL HIGH (ref 65–99)
POTASSIUM: 3.4 mmol/L — AB (ref 3.5–5.1)
Sodium: 136 mmol/L (ref 135–145)

## 2016-12-19 LAB — CBC
HCT: 31.7 % — ABNORMAL LOW (ref 36.0–46.0)
Hemoglobin: 10 g/dL — ABNORMAL LOW (ref 12.0–15.0)
MCH: 27.2 pg (ref 26.0–34.0)
MCHC: 31.5 g/dL (ref 30.0–36.0)
MCV: 86.4 fL (ref 78.0–100.0)
Platelets: 177 10*3/uL (ref 150–400)
RBC: 3.67 MIL/uL — ABNORMAL LOW (ref 3.87–5.11)
RDW: 14.3 % (ref 11.5–15.5)
WBC: 5.5 10*3/uL (ref 4.0–10.5)

## 2016-12-19 MED ORDER — ONDANSETRON HCL 4 MG PO TABS: 4.0000 mg | ORAL_TABLET | Freq: Four times a day (QID) | ORAL | 0 refills | Status: DC | PRN

## 2016-12-19 MED ORDER — ASPIRIN 81 MG PO CHEW: 81.0000 mg | CHEWABLE_TABLET | Freq: Two times a day (BID) | ORAL | 1 refills | Status: DC

## 2016-12-19 MED ORDER — DOCUSATE SODIUM 100 MG PO CAPS: 100.0000 mg | ORAL_CAPSULE | Freq: Two times a day (BID) | ORAL | 1 refills | Status: DC

## 2016-12-19 MED ORDER — HYDROCODONE-ACETAMINOPHEN 5-325 MG PO TABS: 1.0000 | ORAL_TABLET | ORAL | 0 refills | Status: DC | PRN

## 2016-12-19 MED ORDER — SENNA 8.6 MG PO TABS: 2.0000 | ORAL_TABLET | Freq: Every day | ORAL | 0 refills | Status: DC

## 2016-12-19 NOTE — Evaluation (Signed)
Physical Therapy Evaluation Patient Details Name: Natalie Page MRN: [id number] DOB: 07/27/1946 Today's Date: 12/19/2016   History of Present Illness  Pt is a 71 yo female s/p elective L TKA. PMH: uncomplicated.  Clinical Impression  Pt is s/p TKA resulting in the deficits listed below (see PT Problem List). Pt tolerating mobility well.  Pt will benefit from skilled PT to increase their independence and safety with mobility to allow discharge to the venue listed below.      Follow Up Recommendations Outpatient PT    Equipment Recommendations  None recommended by PT    Recommendations for Other Services       Precautions / Restrictions Precautions Precautions: Knee Precaution Booklet Issued: Yes (comment) Restrictions Weight Bearing Restrictions: Yes LLE Weight Bearing: Weight bearing as tolerated      Mobility  Bed Mobility               General bed mobility comments: pt up in chair  Transfers Overall transfer level: Needs assistance Equipment used: Rolling walker (2 wheeled) Transfers: Sit to/from Stand Sit to Stand: Min guard         General transfer comment: v/c's for hand placement  Ambulation/Gait Ambulation/Gait assistance: Min guard Ambulation Distance (Feet): 150 Feet Assistive device: Rolling walker (2 wheeled) Gait Pattern/deviations: Step-to pattern;Decreased stride length;Decreased stance time - left;Decreased step length - right Gait velocity: slow Gait velocity interpretation: Below normal speed for age/gender General Gait Details: initiall required v/c's for sequencing and was step to pattern but then transitioned to shortened step through pattern, v/c's to minimize UE WBing to promote L LE Wbing  Stairs            Wheelchair Mobility    Modified Rankin (Stroke Patients Only)       Balance Overall balance assessment:  (needs walker for ambulation)                                           Pertinent  Vitals/Pain Pain Assessment: 0-10 Pain Score: 5  Pain Location: L knee Pain Descriptors / Indicators: Sore Pain Intervention(s): Monitored during session    Home Living Family/patient expects to be discharged to:: Private residence Living Arrangements: Spouse/significant other Available Help at Discharge: Family;Available PRN/intermittently Type of Home: House Home Access: Stairs to enter Entrance Stairs-Rails: None Entrance Stairs-Number of Steps: 1 Home Layout: One level Home Equipment: Walker - 2 wheels;Bedside commode      Prior Function Level of Independence: Independent               Hand Dominance   Dominant Hand: Right    Extremity/Trunk Assessment   Upper Extremity Assessment Upper Extremity Assessment: Overall WFL for tasks assessed    Lower Extremity Assessment Lower Extremity Assessment: LLE deficits/detail LLE Deficits / Details: able to complete quad set and initiate hip/knee flexxoin with AA    Cervical / Trunk Assessment Cervical / Trunk Assessment: Normal  Communication   Communication: No difficulties  Cognition Arousal/Alertness: Awake/alert Behavior During Therapy: WFL for tasks assessed/performed Overall Cognitive Status: Within Functional Limits for tasks assessed                                 General Comments: w      General Comments General comments (skin integrity, edema, etc.): L knee with ace wrap  Exercises Total Joint Exercises Ankle Circles/Pumps: AROM;Both;10 reps;Seated Quad Sets: AROM;Left;10 reps;Seated Heel Slides: AROM;Left;20 reps;Seated (used towel on the floor)   Assessment/Plan    PT Assessment Patient needs continued PT services  PT Problem List Decreased strength;Decreased range of motion;Decreased activity tolerance;Decreased balance;Decreased mobility;Decreased coordination;Decreased knowledge of use of DME;Decreased safety awareness;Pain       PT Treatment Interventions DME  instruction;Gait training;Stair training;Therapeutic activities;Functional mobility training;Therapeutic exercise;Balance training;Neuromuscular re-education    PT Goals (Current goals can be found in the Care Plan section)  Acute Rehab PT Goals Patient Stated Goal: home tomorrow PT Goal Formulation: With patient Time For Goal Achievement: 12/22/16 Potential to Achieve Goals: Good    Frequency 7X/week   Barriers to discharge        Co-evaluation               AM-PAC PT "6 Clicks" Daily Activity  Outcome Measure Difficulty turning over in bed (including adjusting bedclothes, sheets and blankets)?: A Little Difficulty moving from lying on back to sitting on the side of the bed? : A Little Difficulty sitting down on and standing up from a chair with arms (e.g., wheelchair, bedside commode, etc,.)?: A Little Help needed moving to and from a bed to chair (including a wheelchair)?: A Little Help needed walking in hospital room?: A Little Help needed climbing 3-5 steps with a railing? : A Little 6 Click Score: 18    End of Session Equipment Utilized During Treatment: Gait belt Activity Tolerance: Patient tolerated treatment well Patient left: in chair;with call bell/phone within reach;with family/visitor present Nurse Communication: Mobility status PT Visit Diagnosis: Muscle weakness (generalized) (M62.81);Pain;Difficulty in walking, not elsewhere classified (R26.2) Pain - Right/Left: Left Pain - part of body: Knee    Time: 2119-4174 PT Time Calculation (min) (ACUTE ONLY): 30 min   Charges:   PT Evaluation $PT Eval Moderate Complexity: 1 Procedure PT Treatments $Gait Training: 8-22 mins   PT G Codes:        Kittie Plater, PT, DPT Pager #: [phone] Office #: [phone]   [place] 12/19/2016, 10:13 AM

## 2016-12-19 NOTE — Evaluation (Signed)
Occupational Therapy Evaluation Patient Details Name: Natalie Page MRN: [id number] DOB: [birth year] Today's Date: 12/19/2016    History of Present Illness Pt is a 71 yo female s/p elective L TKA. PMH: uncomplicated.   Clinical Impression   Pt admitted with above and presents to OT at overall min guard - supervision with mobility and self-care tasks.  Pt required min cues for hand placement with sit <> stand, able to recall as session progressed.  Completed dressing with close supervision, except requiring assist to don Lt sock.  Pt's husband present during session and able to provide supervision/setup level for pt with self-care tasks.  OP PT scheduled for Friday.  No follow up OT recommended.    Follow Up Recommendations  No OT follow up;Supervision/Assistance - 24 hour    Equipment Recommendations  None recommended by OT    Recommendations for Other Services       Precautions / Restrictions Precautions Precautions: Knee Precaution Booklet Issued: Yes (comment) Restrictions Weight Bearing Restrictions: Yes LLE Weight Bearing: Weight bearing as tolerated      Mobility Bed Mobility Overal bed mobility: Needs Assistance Bed Mobility: Supine to Sit     Supine to sit: Supervision     General bed mobility comments: pt up in chair  Transfers Overall transfer level: Needs assistance Equipment used: Rolling walker (2 wheeled) Transfers: Sit to/from Stand Sit to Stand: Min guard         General transfer comment: verbal cues for hand placement    Balance Overall balance assessment:  (needs walker for ambulation)                                         ADL either performed or assessed with clinical judgement   ADL Overall ADL's : Needs assistance/impaired     Grooming: Standing;Supervision/safety       Lower Body Bathing: Minimal assistance;Sit to/from stand Lower Body Bathing Details (indicate cue type and reason): requires assist to wash  Lt foot Upper Body Dressing : Supervision/safety;Standing   Lower Body Dressing: Minimal assistance;Sit to/from stand Lower Body Dressing Details (indicate cue type and reason): required assist to don Lt sock and shoe, min guard/close supervision during standing to pull underwear and pants over hips Toilet Transfer: Min guard;Ambulation;RW;BSC Toilet Transfer Details (indicate cue type and reason): BSC over toilet, pt has BSC at home.  Provided min cues for hand placement with sit <> stand for safety         Functional mobility during ADLs: Min guard;Rolling walker General ADL Comments: Provided min cues for hand placement with sit <>stand for increased safety     Vision   Eye Alignment: Within Functional Limits            Pertinent Vitals/Pain Pain Assessment: 0-10 Pain Score: 3  Pain Location: L knee Pain Descriptors / Indicators: Sore Pain Intervention(s): Monitored during session;Repositioned;Ice applied     Hand Dominance Right   Extremity/Trunk Assessment Upper Extremity Assessment Upper Extremity Assessment: Overall WFL for tasks assessed   Lower Extremity Assessment Lower Extremity Assessment: LLE deficits/detail LLE Deficits / Details: able to complete quad set and initiate hip/knee flexxoin with AA   Cervical / Trunk Assessment Cervical / Trunk Assessment: Normal   Communication Communication Communication: No difficulties   Cognition Arousal/Alertness: Awake/alert Behavior During Therapy: WFL for tasks assessed/performed Overall Cognitive Status: Within Functional Limits for tasks assessed  General Comments  L knee with ace wrap            Home Living Family/patient expects to be discharged to:: Private residence Living Arrangements: Spouse/significant other Available Help at Discharge: Family;Available PRN/intermittently Type of Home: House Home Access: Stairs to enter [employer] of  Steps: 1 Entrance Stairs-Rails: None Home Layout: One level     Bathroom Shower/Tub: Occupational psychologist: Standard     Home Equipment: Environmental consultant - 2 wheels;Bedside commode;Shower seat - built in          Prior Functioning/Environment Level of Independence: Independent                 OT Problem List: Decreased range of motion;Decreased activity tolerance;Impaired balance (sitting and/or standing);Pain      OT Treatment/Interventions:      OT Goals(Current goals can be found in the care plan section) Acute Rehab OT Goals Patient Stated Goal: home tomorrow OT Goal Formulation: All assessment and education complete, DC therapy   AM-PAC PT "6 Clicks" Daily Activity     Outcome Measure Help from another person eating meals?: None Help from another person taking care of personal grooming?: None Help from another person toileting, which includes using toliet, bedpan, or urinal?: A Little Help from another person bathing (including washing, rinsing, drying)?: A Little Help from another person to put on and taking off regular upper body clothing?: None Help from another person to put on and taking off regular lower body clothing?: A Little 6 Click Score: 21   End of Session Equipment Utilized During Treatment: Gait belt;Rolling walker Nurse Communication: Mobility status  Activity Tolerance: Patient tolerated treatment well Patient left: in chair;with call bell/phone within reach;with family/visitor present  OT Visit Diagnosis: Pain;Other abnormalities of gait and mobility (R26.89);Muscle weakness (generalized) (M62.81) Pain - Right/Left: Left Pain - part of body: Knee                Time: 1313-1340 OT Time Calculation (min): 27 min Charges:  OT General Charges $OT Visit: 1 Procedure OT Evaluation $OT Eval Low Complexity: 1 Procedure OT Treatments $Self Care/Home Management : 8-22 mins   Simonne Come, [phone] 12/19/2016, 1:50 PM

## 2016-12-19 NOTE — Anesthesia Postprocedure Evaluation (Signed)
Anesthesia Post Note  Patient: Natalie Page  Procedure(s) Performed: Procedure(s) (LRB): LEFT TOTAL KNEE ARTHROPLASTY WITH COMPUTER NAVIGATION (Left)  Patient location during evaluation: PACU Anesthesia Type: Spinal Level of consciousness: oriented and awake and alert Pain management: pain level controlled Vital Signs Assessment: post-procedure vital signs reviewed and stable Respiratory status: spontaneous breathing, respiratory function stable and patient connected to nasal cannula oxygen Cardiovascular status: blood pressure returned to baseline and stable Postop Assessment: no headache and no backache Anesthetic complications: no       Last Vitals:  Vitals:   12/19/16 0224 12/19/16 0527  BP: 124/61 129/60  Pulse: 84 81  Resp: 18 16  Temp:  37 C    Last Pain:  Vitals:   12/19/16 0527  TempSrc: Oral  PainSc:                  [name]

## 2016-12-19 NOTE — Progress Notes (Signed)
Physical Therapy Treatment Patient Details Name: Natalie Page MRN: [id number] DOB: 1945-08-28 Today's Date: 12/19/2016    History of Present Illness Pt is a 71 yo female s/p elective L TKA. PMH: uncomplicated.    PT Comments    Patient was seen with husband present. Reviewed remaining HEP; however did not perform straight leg raises as pt was up in chair and unable to perform. Plan to review SLR tomorrow am. Pt ambulated 270 ft with min guard to PT gym where pt practiced negotiating 1 step to simulate home environment. Patient would benefit from continued skilled PT. Will continue to follow acutely.     Follow Up Recommendations  Outpatient PT     Equipment Recommendations  None recommended by PT    Recommendations for Other Services       Precautions / Restrictions Precautions Precautions: Knee Precaution Booklet Issued: Yes (comment) Restrictions Weight Bearing Restrictions: Yes LLE Weight Bearing: Weight bearing as tolerated    Mobility  Bed Mobility Overal bed mobility: Modified Independent Bed Mobility: Sit to Supine     Supine to sit: Modified independent (Device/Increase time)     General bed mobility comments: pt up in chair  Transfers Overall transfer level: Needs assistance Equipment used: Rolling walker (2 wheeled) Transfers: Sit to/from Stand Sit to Stand: Min guard         General transfer comment: VC for walker management and technique to transition from stand>sit  Ambulation/Gait Ambulation/Gait assistance: Min guard Ambulation Distance (Feet): 270 Feet Assistive device: Rolling walker (2 wheeled) Gait Pattern/deviations: Step-to pattern;Decreased stride length;Decreased stance time - left;Decreased step length - right;Step-through pattern Gait velocity: slow Gait velocity interpretation: Below normal speed for age/gender General Gait Details: Transitioned from step to pattern to step through pattern with cueing for increased step length  on the R LE.   Stairs Stairs: Yes   Stair Management: No rails;Step to pattern;Forwards;With walker Number of Stairs: 1 (x2) General stair comments: pt has 1 curb like step to enter home. Pt instructed on proper technique. After instruction pt was able to demonstrate and verbalize correct technique.  Wheelchair Mobility    Modified Rankin (Stroke Patients Only)       Balance Overall balance assessment:  (needs walker for ambulation)                                          Cognition Arousal/Alertness: Awake/alert Behavior During Therapy: WFL for tasks assessed/performed Overall Cognitive Status: Within Functional Limits for tasks assessed                                        Exercises Total Joint Exercises Short Arc Quad: AROM;Left;10 reps;Seated Heel Slides: AROM;Left;20 reps;Seated (with towel on floor) Hip ABduction/ADduction: AROM;Left;10 reps;Seated Long Arc Quad: AROM;Left;10 reps;Seated Knee Flexion: AROM;Left;10 reps;Seated    General Comments        Pertinent Vitals/Pain Pain Assessment: 0-10 Pain Score: 2  Pain Location: L knee Pain Descriptors / Indicators: Sore Pain Intervention(s): Monitored during session;Repositioned;Ice applied    Home Living Family/patient expects to be discharged to:: Private residence Living Arrangements: Spouse/significant other Available Help at Discharge: Family;Available PRN/intermittently Type of Home: House Home Access: Stairs to enter Entrance Stairs-Rails: None Home Layout: One level Home Equipment: Environmental consultant - 2 wheels;Bedside commode;Shower seat - built  in      Prior Function Level of Independence: Independent          PT Goals (current goals can now be found in the care plan section) Acute Rehab PT Goals Patient Stated Goal: home tomorrow PT Goal Formulation: With patient Time For Goal Achievement: 12/22/16 Potential to Achieve Goals: Good Progress towards PT goals:  Progressing toward goals    Frequency    7X/week      PT Plan Current plan remains appropriate    Co-evaluation              AM-PAC PT "6 Clicks" Daily Activity  Outcome Measure  Difficulty turning over in bed (including adjusting bedclothes, sheets and blankets)?: A Little Difficulty moving from lying on back to sitting on the side of the bed? : A Little Difficulty sitting down on and standing up from a chair with arms (e.g., wheelchair, bedside commode, etc,.)?: A Little Help needed moving to and from a bed to chair (including a wheelchair)?: A Little Help needed walking in hospital room?: A Little Help needed climbing 3-5 steps with a railing? : A Little 6 Click Score: 18    End of Session Equipment Utilized During Treatment: Gait belt Activity Tolerance: Patient tolerated treatment well Patient left: with call bell/phone within reach;with family/visitor present;in bed   PT Visit Diagnosis: Muscle weakness (generalized) (M62.81);Pain;Difficulty in walking, not elsewhere classified (R26.2) Pain - Right/Left: Left Pain - part of body: Knee     Time: 8250-0370 PT Time Calculation (min) (ACUTE ONLY): 27 min  Charges:  $Gait Training: 8-22 mins $Therapeutic Exercise: 8-22 mins                    G Codes:       Benjiman Core, PTA Acute Rehab [place] 12/19/2016, 3:36 PM

## 2016-12-19 NOTE — Discharge Summary (Signed)
Physician Discharge Summary  Patient ID: Natalie Page MRN: [id number] DOB/AGE: Feb 01, 1946 71 y.o.  Admit date: 12/18/2016 Discharge date: 12/20/2016  Admission Diagnoses:  Osteoarthritis of left knee  Discharge Diagnoses:  Principal Problem:   Osteoarthritis of left knee   Past Medical History:  Diagnosis Date  . Adenomatous polyp of colon   . 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 Cervical disc disorder   . Diverticular disease   . Endometriosis   . GERD (gastroesophageal reflux disease)   . H/O cardiovascular stress test 20 yrs. ago- 1990's   told that it was normal  . Hypertension   . Multinodular goiter   . Osteoarthritis   . Osteopenia   . Partial deafness   . Peripheral neuropathy   . TMJ (dislocation of temporomandibular joint)   . Varicose vein of leg    bilateral    Surgeries: Procedure(s): LEFT TOTAL KNEE ARTHROPLASTY WITH COMPUTER NAVIGATION on 12/18/2016   Consultants (if any):   Discharged Condition: Improved  Hospital Course: Natalie Page is an 71 y.o. female who was admitted 12/18/2016 with a diagnosis of Osteoarthritis of left knee and went to the operating room on 12/18/2016 and underwent the above named procedures.    She was given perioperative antibiotics:  Anti-infectives    Start     Dose/Rate Route Frequency Ordered Stop   12/18/16 1730  ceFAZolin (ANCEF) IVPB 1 g/50 mL premix     1 g 100 mL/hr over 30 Minutes Intravenous Every 6 hours 12/18/16 1638 12/18/16 2320   12/18/16 1000  ceFAZolin (ANCEF) IVPB 2g/100 mL premix     2 g 200 mL/hr over 30 Minutes Intravenous To [facility] 12/15/16 0823 12/18/16 1115    .  She was given sequential compression devices, early ambulation, and ASA for DVT prophylaxis.  She benefited maximally from the hospital stay and there were no complications.    Recent vital signs:  Vitals:   12/19/16 2127 12/20/16 0527  BP: 139/72 130/69  Pulse: 88 79  Resp: 18 18  Temp: 97.5 F  (36.4 C) 98.2 F (36.8 C)    Recent laboratory studies:  Lab Results  Component Value Date   HGB 9.5 (L) 12/20/2016   HGB 10.0 (L) 12/19/2016   HGB 12.3 12/07/2016   Lab Results  Component Value Date   WBC 7.6 12/20/2016   PLT 187 12/20/2016   No results found for: INR Lab Results  Component Value Date   NA 136 12/19/2016   K 3.4 (L) 12/19/2016   CL 105 12/19/2016   CO2 26 12/19/2016   BUN 5 (L) 12/19/2016   CREATININE 0.55 12/19/2016   GLUCOSE 115 (H) 12/19/2016    Discharge Medications:   Allergies as of 12/20/2016      Reactions   No Known Allergies       Medication List    STOP taking these medications   acetaminophen 650 MG CR tablet Commonly known as:  TYLENOL   hydroxychloroquine 200 MG tablet Commonly known as:  PLAQUENIL     TAKE these medications   aspirin 81 MG chewable tablet Chew 1 tablet (81 mg total) by mouth 2 (two) times daily.   CALCIUM + D PO Takes 1200mg  - 1000 IU tablet daily.   docusate sodium 100 MG capsule Commonly known as:  COLACE Take 1 capsule (100 mg total) by mouth 2 (two) times daily.   furosemide 20 MG tablet Commonly known as:  LASIX Take 20 mg by mouth daily.   gabapentin 800 MG tablet Commonly known as:  NEURONTIN Take 1 tablet (800 mg total) by mouth 3 (three) times daily.   hydrochlorothiazide 25 MG tablet Commonly known as:  HYDRODIURIL Take 25 mg by mouth daily.   HYDROcodone-acetaminophen 5-325 MG tablet Commonly known as:  NORCO/VICODIN Take 1-2 tablets by mouth every 4 (four) hours as needed (breakthrough pain).   losartan 50 MG tablet Commonly known as:  COZAAR Take 50 mg by mouth daily.   magnesium oxide 400 MG tablet Commonly known as:  MAG-OX Take 400 mg by mouth every evening.   nortriptyline 25 MG capsule Commonly known as:  PAMELOR Take 25 mg by mouth at bedtime.   omeprazole 20 MG capsule Commonly known as:  PRILOSEC Take 20 mg by mouth daily.   ondansetron 4 MG tablet Commonly  known as:  ZOFRAN Take 1 tablet (4 mg total) by mouth every 6 (six) hours as needed for nausea.   OVER THE COUNTER MEDICATION Take 2 tablets by mouth daily. Med Name: Juice + Garden Blend supplement daily.   senna 8.6 MG Tabs tablet Commonly known as:  SENOKOT Take 2 tablets (17.2 mg total) by mouth at bedtime.   UNABLE TO FIND Take 2 tablets by mouth daily. Med Name: Juice + [employer] supplement daily.   Vitamin B-12 5000 MCG Tbdp Take 5,000 mcg by mouth daily.   Vitamin D3 5000 units Tabs Take 5,000 Units by mouth daily.       Diagnostic Studies: Dg Knee Left Port  Result Date: 12/18/2016 CLINICAL DATA:  Status post left knee replacement today. EXAM: PORTABLE LEFT KNEE - 1-2 VIEW COMPARISON:  None. 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 Hardware is intact. There is no fracture. Gas in the soft tissues from surgery noted. IMPRESSION: Status post left total knee replacement.  No acute abnormality. Electronically Signed   By: Inge Rise M.D.   On: 12/18/2016 14:04    Disposition: Final discharge disposition not confirmed  Discharge Instructions    Call MD / Call 911    Complete by:  As directed    If you experience chest pain or shortness of breath, CALL 911 and be transported to the hospital emergency room.  If you develope a fever above 101 F, pus (white drainage) or increased drainage or redness at the wound, or calf pain, call your surgeon's office.   Constipation Prevention    Complete by:  As directed    Drink plenty of fluids.  Prune juice may be helpful.  You may use a stool softener, such as Colace (over the counter) 100 mg twice a day.  Use MiraLax (over the counter) for constipation as needed.   Diet - low sodium heart healthy    Complete by:  As directed    Do not put a pillow under the knee. Place it under the heel.    Complete by:  As directed    Driving restrictions    Complete by:  As directed    No driving for 6 weeks   Increase  activity slowly as tolerated    Complete by:  As directed    Lifting restrictions    Complete by:  As directed    No lifting for 6 weeks   TED hose    Complete by:  As directed    Use stockings (TED hose) for 2 weeks on both leg(s).  You may remove them at night  for sleeping.      Follow-up Information    Chrisie Jankovich, Horald Pollen, MD. Schedule an appointment as soon as possible for a visit in 2 weeks.   Specialty:  Orthopedic Surgery Why:  For wound re-check Contact information: [address] [place] [address] [phone]            Signed: Elie Goody 12/20/2016, 7:47 AM

## 2016-12-19 NOTE — Progress Notes (Signed)
   Subjective:  Patient reports pain as mild to moderate.  No c/o. Denies N/V/CP/SOB.  Objective:   VITALS:   Vitals:   12/18/16 1644 12/18/16 2117 12/19/16 0224 12/19/16 0527  BP: (!) 168/70 (!) 169/73 124/61 129/60  Pulse: 71 79 84 81  Resp: 18 18 18 16   Temp: 98.3 F (36.8 C) 98.4 F (36.9 C)  98.6 F (37 C)  TempSrc: Oral Oral  Oral  SpO2: 100% 98% 97% 97%  Weight:      Height:        NAD ABD soft Sensation intact distally Intact pulses distally Dorsiflexion/Plantar flexion intact Incision: dressing C/D/I Compartment soft   Lab Results  Component Value Date   WBC 5.5 12/19/2016   HGB 10.0 (L) 12/19/2016   HCT 31.7 (L) 12/19/2016   MCV 86.4 12/19/2016   PLT 177 12/19/2016   BMET    Component Value Date/Time   NA 136 12/19/2016 0425   K 3.4 (L) 12/19/2016 0425   CL 105 12/19/2016 0425   CO2 26 12/19/2016 0425   GLUCOSE 115 (H) 12/19/2016 0425   BUN 5 (L) 12/19/2016 0425   CREATININE 0.55 12/19/2016 0425   CALCIUM 8.7 (L) 12/19/2016 0425   GFRNONAA >60 12/19/2016 0425   GFRAA >60 12/19/2016 0425     Assessment/Plan: 1 Day Post-Op   Principal Problem:   Osteoarthritis of left knee   WBAT with walker DVT ppx: ASA, SCDs, foot pumps PO pain control PT/OT Dispo: D/C home tomorrow, outpatient PT set up, already has DME   Annaliah Rivenbark, Horald Pollen 12/19/2016, 7:39 AM   Rod Can, MD Cell [phone]

## 2016-12-20 LAB — CBC
HCT: 29.1 % — ABNORMAL LOW (ref 36.0–46.0)
Hemoglobin: 9.5 g/dL — ABNORMAL LOW (ref 12.0–15.0)
MCH: 27.7 pg (ref 26.0–34.0)
MCHC: 32.6 g/dL (ref 30.0–36.0)
MCV: 84.8 fL (ref 78.0–100.0)
PLATELETS: 187 10*3/uL (ref 150–400)
RBC: 3.43 MIL/uL — ABNORMAL LOW (ref 3.87–5.11)
RDW: 14.2 % (ref 11.5–15.5)
WBC: 7.6 10*3/uL (ref 4.0–10.5)

## 2016-12-20 NOTE — Progress Notes (Signed)
Physical Therapy Treatment Patient Details Name: Natalie Page MRN: [id number] DOB: 1946/07/25 Today's Date: 12/20/2016    History of Present Illness Pt is a 71 yo female s/p elective L TKA. PMH: uncomplicated.    PT Comments    Patient mobilizing well with therapy and making progress towards goals. Pt ambulated 280 ft with supervision and min cueing for step through pattern. Steady gait, but ambulates slowly. Pt transfers with min guard, and needs mod cueing for correct technique and hand placement for sit<>stand. Pt expected to d/c today and continue with OP PT on Friday.    Follow Up Recommendations  Outpatient PT     Equipment Recommendations  None recommended by PT    Recommendations for Other Services       Precautions / Restrictions Precautions Precautions: Knee Precaution Booklet Issued: Yes (comment) Restrictions Weight Bearing Restrictions: Yes LLE Weight Bearing: Weight bearing as tolerated    Mobility  Bed Mobility Overal bed mobility: Modified Independent Bed Mobility: Sit to Supine;Supine to Sit     Supine to sit: Modified independent (Device/Increase time) Sit to supine: Modified independent (Device/Increase time)      Transfers Overall transfer level: Needs assistance Equipment used: Rolling walker (2 wheeled) Transfers: Sit to/from Stand Sit to Stand: Min guard         General transfer comment: VC required for proper technique and hand placement during sit<>stand  Ambulation/Gait Ambulation/Gait assistance: Supervision Ambulation Distance (Feet): 280 Feet Assistive device: Rolling walker (2 wheeled) Gait Pattern/deviations: Step-to pattern;Decreased stride length;Decreased stance time - left;Decreased step length - right;Step-through pattern Gait velocity: slow Gait velocity interpretation: Below normal speed for age/gender General Gait Details: VC for step through pattern. Pt steady but decreased gait speed.   Stairs             Wheelchair Mobility    Modified Rankin (Stroke Patients Only)       Balance Overall balance assessment: Needs assistance (needs walker for ambulation) Sitting-balance support: No upper extremity supported;Feet supported Sitting balance-Leahy Scale: Good     Standing balance support: Bilateral upper extremity supported Standing balance-Leahy Scale: Poor (reliant on walker)                              Cognition Arousal/Alertness: Awake/alert Behavior During Therapy: WFL for tasks assessed/performed Overall Cognitive Status: Within Functional Limits for tasks assessed                                        Exercises Total Joint Exercises Straight Leg Raises: AROM;Strengthening;Left;10 reps;Supine    General Comments        Pertinent Vitals/Pain Pain Assessment: Faces Faces Pain Scale: Hurts a little bit Pain Location: L knee Pain Descriptors / Indicators: Sore Pain Intervention(s): Premedicated before session;Monitored during session;Limited activity within patient's tolerance;RN gave pain meds during session    Home Living                      Prior Function            PT Goals (current goals can now be found in the care plan section) Acute Rehab PT Goals Patient Stated Goal: home tomorrow PT Goal Formulation: With patient Time For Goal Achievement: 12/22/16 Potential to Achieve Goals: Good Progress towards PT goals: Progressing toward goals    Frequency  7X/week      PT Plan Current plan remains appropriate    Co-evaluation              AM-PAC PT "6 Clicks" Daily Activity  Outcome Measure  Difficulty turning over in bed (including adjusting bedclothes, sheets and blankets)?: A Little Difficulty moving from lying on back to sitting on the side of the bed? : A Little Difficulty sitting down on and standing up from a chair with arms (e.g., wheelchair, bedside commode, etc,.)?: A Little Help needed  moving to and from a bed to chair (including a wheelchair)?: A Little Help needed walking in hospital room?: A Little Help needed climbing 3-5 steps with a railing? : A Little 6 Click Score: 18    End of Session Equipment Utilized During Treatment: Gait belt Activity Tolerance: Patient tolerated treatment well Patient left: with call bell/phone within reach;with family/visitor present;in bed   PT Visit Diagnosis: Muscle weakness (generalized) (M62.81);Pain;Difficulty in walking, not elsewhere classified (R26.2) Pain - Right/Left: Left Pain - part of body: Knee     Time: 1747-1595 PT Time Calculation (min) (ACUTE ONLY): 26 min  Charges:  $Gait Training: 8-22 mins $Therapeutic Activity: 8-22 mins                    G Codes:      Benjiman Core, PTA Acute Rehab [place] 12/20/2016, 10:19 AM

## 2016-12-20 NOTE — Progress Notes (Signed)
   Subjective:  Patient reports pain as mild to moderate.  No c/o. Denies N/V/CP/SOB.  Objective:   VITALS:   Vitals:   12/19/16 0527 12/19/16 1405 12/19/16 2127 12/20/16 0527  BP: 129/60 (!) 144/72 139/72 130/69  Pulse: 81 91 88 79  Resp: 16 17 18 18   Temp: 98.6 F (37 C) 98.1 F (36.7 C) 97.5 F (36.4 C) 98.2 F (36.8 C)  TempSrc: Oral Oral Oral Oral  SpO2: 97% 98% 98% 98%  Weight:      Height:        NAD ABD soft Sensation intact distally Intact pulses distally Dorsiflexion/Plantar flexion intact Incision: dressing C/D/I Compartment soft   Lab Results  Component Value Date   WBC 7.6 12/20/2016   HGB 9.5 (L) 12/20/2016   HCT 29.1 (L) 12/20/2016   MCV 84.8 12/20/2016   PLT 187 12/20/2016   BMET    Component Value Date/Time   NA 136 12/19/2016 0425   K 3.4 (L) 12/19/2016 0425   CL 105 12/19/2016 0425   CO2 26 12/19/2016 0425   GLUCOSE 115 (H) 12/19/2016 0425   BUN 5 (L) 12/19/2016 0425   CREATININE 0.55 12/19/2016 0425   CALCIUM 8.7 (L) 12/19/2016 0425   GFRNONAA >60 12/19/2016 0425   GFRAA >60 12/19/2016 0425     Assessment/Plan: 2 Days Post-Op   Principal Problem:   Osteoarthritis of left knee   WBAT with walker DVT ppx: ASA, SCDs, foot pumps PO pain control PT/OT Dispo: D/C home, outpatient PT set up, already has DME   Natalie Page, Natalie Page 12/20/2016, 7:46 AM   Rod Can, MD Cell [phone]

## 2016-12-20 NOTE — Progress Notes (Signed)
Discharge instructions, RXs and follow up appts explained and provided to patient verbalized understanding. Therapy was set up for patient. Patient left floor via wheelchair accompanied by staff no c/o pain or shortness of breath at d/c.  Jerilyn Gillaspie, Tivis Ringer, RN

## 2017-04-09 ENCOUNTER — Ambulatory Visit: Payer: Medicare Other | Admitting: Adult Health

## 2017-06-19 ENCOUNTER — Other Ambulatory Visit: Payer: Self-pay | Admitting: Specialist

## 2017-06-19 DIAGNOSIS — Z1231 Encounter for screening mammogram for malignant neoplasm of breast: Secondary | ICD-10-CM

## 2017-07-03 DIAGNOSIS — Z1211 Encounter for screening for malignant neoplasm of colon: Secondary | ICD-10-CM | POA: Insufficient documentation

## 2017-07-03 DIAGNOSIS — Z0000 Encounter for general adult medical examination without abnormal findings: Secondary | ICD-10-CM | POA: Insufficient documentation

## 2017-07-03 DIAGNOSIS — Z13820 Encounter for screening for osteoporosis: Secondary | ICD-10-CM | POA: Insufficient documentation

## 2017-07-03 DIAGNOSIS — Z1159 Encounter for screening for other viral diseases: Secondary | ICD-10-CM | POA: Insufficient documentation

## 2017-07-17 ENCOUNTER — Ambulatory Visit
Admission: RE | Admit: 2017-07-17 | Discharge: 2017-07-17 | Disposition: A | Discharge status: Home / Self Care | Payer: Medicare Other | Source: Ambulatory Visit | Attending: Specialist | Admitting: Specialist

## 2017-07-17 DIAGNOSIS — Z1231 Encounter for screening mammogram for malignant neoplasm of breast: Secondary | ICD-10-CM

## 2017-08-01 ENCOUNTER — Encounter: Payer: Self-pay | Admitting: Internal Medicine

## 2017-10-03 ENCOUNTER — Other Ambulatory Visit: Payer: Self-pay | Admitting: *Deleted

## 2017-10-03 ENCOUNTER — Ambulatory Visit (INDEPENDENT_AMBULATORY_CARE_PROVIDER_SITE_OTHER): Payer: Medicare Other | Admitting: Gastroenterology

## 2017-10-03 ENCOUNTER — Encounter: Payer: Self-pay | Admitting: *Deleted

## 2017-10-03 ENCOUNTER — Encounter: Payer: Self-pay | Admitting: Gastroenterology

## 2017-10-03 VITALS — BP 122/71 | HR 82 | Temp 96.8°F | Ht 64.0 in | Wt 152.6 lb

## 2017-10-03 DIAGNOSIS — K219 Gastro-esophageal reflux disease without esophagitis: Secondary | ICD-10-CM

## 2017-10-03 DIAGNOSIS — Z86010 Personal history of colonic polyps: Secondary | ICD-10-CM

## 2017-10-03 MED ORDER — NA SULFATE-K SULFATE-MG SULF 17.5-3.13-1.6 GM/177ML PO SOLN: 1.0000 | ORAL | 0 refills | Status: DC

## 2017-10-03 NOTE — Patient Instructions (Signed)
You have been scheduled for a colonoscopy with Dr. Gala Romney in the near future.  We will see you in 1 year for routine follow-up due to history of reflux.  Please let us know if you have any problems in the interim!  It was a pleasure to see you today. I strive to create trusting relationships with patients to provide genuine, compassionate, and quality care. I value your feedback. If you receive a survey regarding your visit,  I greatly appreciate you the taking time to fill this out.   Annitta Needs, PhD, ANP-BC [facility]

## 2017-10-03 NOTE — H&P (View-Only) (Signed)
Primary Care Physician:  Pomposini, Cherly Anderson, MD Primary Gastroenterologist:  Dr. Gala Romney   Chief Complaint  Patient presents with  . Gastroesophageal Reflux    HPI:   Natalie Page is a 72 y.o. female presenting today as a self-referral to establish GI care. She was previously seen by Dr. Algis Greenhouse. Last colonoscopy in 2013 with adenomas and due for surveillance now. EGD also in 2013 with hiatal hernia, otherwise normal.   Remote history of polyps as well. History of GERD. Doing well on Prilosec daily. Will rarely take a Zantac. No dysphagia. No abdominal pain. No rectal bleeding. No issues with weight loss. No loss of appetite.   Past Medical History:  Diagnosis Date  . Adenomatous polyp of colon   . 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 Cervical disc disorder   . Diverticular disease   . Endometriosis   . GERD (gastroesophageal reflux disease)   . H/O cardiovascular stress test 20 yrs. ago- 1990's   told that it was normal  . Hypertension   . Multinodular goiter   . Osteoarthritis   . Osteopenia   . Partial deafness   . Peripheral neuropathy   . TMJ (dislocation of temporomandibular joint)   . Varicose vein of leg    bilateral    Past Surgical History:  Procedure Laterality Date  . CATARACT EXTRACTION Bilateral   . COLONOSCOPY  2013   Dr. Algis Greenhouse: small cecal polyp and small transverse colon polyp, left colon diverticulosis, tubular adenoma  . ESOPHAGOGASTRODUODENOSCOPY  03/2012   Dr. Algis Greenhouse: large hiatal hernia, otherwise normal  . EYE SURGERY Right    eyelid  . HAND SURGERY Bilateral 2001  . KNEE ARTHROPLASTY Left 12/18/2016   Procedure: LEFT TOTAL KNEE ARTHROPLASTY WITH COMPUTER NAVIGATION;  Surgeon: Rod Can, MD;  Location: [facility];  Service: Orthopedics;  Laterality: Left;  Dr. request RNFA  . TONSILLECTOMY    . TONSILLECTOMY AND ADENOIDECTOMY      Current Outpatient Medications  Medication Sig Dispense Refill  . acetaminophen  (TYLENOL) 325 MG tablet Take 650 mg by mouth daily.    [name] aspirin 81 MG chewable tablet Chew 1 tablet (81 mg total) by mouth 2 (two) times daily. (Patient taking differently: Chew 81 mg by mouth as needed. ) 60 tablet 1  . BIOTIN 5000 PO Take by mouth daily.    . Calcium Citrate-Vitamin D (CALCIUM + D PO) Takes 1200mg  - 1000 IU tablet daily.     . Cholecalciferol (VITAMIN D3) 5000 units TABS Take 5,000 Units by mouth daily.     . Cyanocobalamin (VITAMIN B-12) 5000 MCG TBDP Take 5,000 mcg by mouth daily.    . diclofenac sodium (VOLTAREN) 1 % GEL Apply topically. 2-3 times a day to feet    . docusate sodium (COLACE) 100 MG capsule Take 1 capsule (100 mg total) by mouth 2 (two) times daily. (Patient taking differently: Take 100 mg by mouth daily as needed. ) 60 capsule 1  . furosemide (LASIX) 20 MG tablet Take 20 mg by mouth daily.     [name] gabapentin (NEURONTIN) 800 MG tablet Take 1 tablet (800 mg total) by mouth 3 (three) times daily. 270 tablet 4  . hydrochlorothiazide (HYDRODIURIL) 25 MG tablet Take 25 mg by mouth daily.     . hydroxychloroquine (PLAQUENIL) 200 MG tablet Take 200 mg by mouth 2 (two) times daily.    [name] losartan (COZAAR) 50 MG tablet Take 50 mg  by mouth daily.     . magnesium oxide (MAG-OX) 400 MG tablet Take 400 mg by mouth every evening.    [name] omeprazole (PRILOSEC) 20 MG capsule Take 20 mg by mouth daily.     [name] OVER THE COUNTER MEDICATION Take 2 tablets by mouth daily. Med Name: Juice + Garden Blend supplement daily.    . ranitidine (ZANTAC) 150 MG tablet Take 150 mg by mouth as needed for heartburn.    [name] UNABLE TO FIND Take 2 tablets by mouth daily. Med Name: Juice + [employer] supplement daily.      No current facility-administered medications for this visit.     Allergies as of 10/03/2017 - Review Complete 10/03/2017  Allergen Reaction Noted  . No known allergies  12/15/2016    Family History  Problem Relation Age of Onset  . Stroke Mother   . COPD Father   . Cancer  Father        Unsure of type - "on his chin"  . Throat cancer Sister   . Stomach cancer Brother   . Breast cancer Sister   . Colon cancer Neg Hx     Social History   Socioeconomic History  . Marital status: Married    Spouse name: Not on file  . Number of children: 0  . Years of education: 72  . Highest education level: Not on file  Social Needs  . Financial resource strain: Not on file  . Food insecurity - worry: Not on file  . Food insecurity - inability: Not on file  . Transportation needs - medical: Not on file  . Transportation needs - non-medical: Not on file  Occupational History  . Occupation: Retired  Tobacco Use  . Smoking status: Never Smoker  . Smokeless tobacco: Never Used  Substance and Sexual Activity  . Alcohol use: Yes    Comment: 2 glasses three times weekly.  . Drug use: No  . Sexual activity: Not on file  Other Topics Concern  . Not on file  Social History Narrative   Lives at home with husband.   Left-handed.   4-5 cups caffeine daily.    Review of Systems: Gen: Denies any fever, chills, fatigue, weight loss, lack of appetite.  CV: Denies chest pain, heart palpitations, peripheral edema, syncope.  Resp: Denies shortness of breath at rest or with exertion. Denies wheezing or cough.  GI: see HPI  GU : Denies urinary burning, urinary frequency, urinary hesitancy MS: Denies joint pain, muscle weakness, cramps, or limitation of movement.  Derm: Denies rash, itching, dry skin Psych: Denies depression, anxiety, memory loss, and confusion Heme: Denies bruising, bleeding, and enlarged lymph nodes.  Physical Exam: BP 122/71   Pulse 82   Temp (!) 96.8 F (36 C) (Oral)   Ht 5\' 4"  (1.626 m)   Wt 152 lb 9.6 oz (69.2 kg)   BMI 26.19 kg/m  General:   Alert and oriented. Pleasant and cooperative. Well-nourished and well-developed.  Head:  Normocephalic and atraumatic. Eyes:  Without icterus, sclera clear and conjunctiva pink.  Ears:  Normal auditory  acuity. Nose:  No deformity, discharge,  or lesions. Mouth:  No deformity or lesions, oral mucosa pink.  Lungs:  Clear to auscultation bilaterally. No wheezes, rales, or rhonchi. No distress.  Heart:  S1, S2 present without murmurs appreciated.  Abdomen:  +BS, soft, non-tender and non-distended. No HSM noted. No guarding or rebound. No masses appreciated.  Rectal:  Deferred  Msk:  Symmetrical without  gross deformities. Normal posture. Extremities:  Without edema. Neurologic:  Alert and  oriented x Psych:  Alert and cooperative. Normal mood and affect.

## 2017-10-03 NOTE — Assessment & Plan Note (Signed)
On Prilosec chronically. EGD in remote past by Dr Algis Greenhouse. No alarm symptoms. Continue Prilosec daily. Return in 1 year.

## 2017-10-03 NOTE — Progress Notes (Signed)
Primary Care Physician:  Pomposini, Cherly Anderson, MD Primary Gastroenterologist:  Dr. Gala Romney   Chief Complaint  Patient presents with  . Gastroesophageal Reflux    HPI:   Natalie Page is a 72 y.o. female presenting today as a self-referral to establish GI care. She was previously seen by Dr. Algis Greenhouse. Last colonoscopy in 2013 with adenomas and due for surveillance now. EGD also in 2013 with hiatal hernia, otherwise normal.   Remote history of polyps as well. History of GERD. Doing well on Prilosec daily. Will rarely take a Zantac. No dysphagia. No abdominal pain. No rectal bleeding. No issues with weight loss. No loss of appetite.   Past Medical History:  Diagnosis Date  . Adenomatous polyp of colon   . Cancer ([place])    on back- found to have basal ca  . Cervical disc disorder   . Diverticular disease   . Endometriosis   . GERD (gastroesophageal reflux disease)   . H/O cardiovascular stress test 20 yrs. ago- 1990's   told that it was normal  . Hypertension   . Multinodular goiter   . Osteoarthritis   . Osteopenia   . Partial deafness   . Peripheral neuropathy   . TMJ (dislocation of temporomandibular joint)   . Varicose vein of leg    bilateral    Past Surgical History:  Procedure Laterality Date  . CATARACT EXTRACTION Bilateral   . COLONOSCOPY  2013   Dr. Algis Greenhouse: small cecal polyp and small transverse colon polyp, left colon diverticulosis, tubular adenoma  . ESOPHAGOGASTRODUODENOSCOPY  03/2012   Dr. Algis Greenhouse: large hiatal hernia, otherwise normal  . EYE SURGERY Right    eyelid  . HAND SURGERY Bilateral 2001  . KNEE ARTHROPLASTY Left 12/18/2016   Procedure: LEFT TOTAL KNEE ARTHROPLASTY WITH COMPUTER NAVIGATION;  Surgeon: Rod Can, MD;  Location: [facility];  Service: Orthopedics;  Laterality: Left;  Dr. request RNFA  . TONSILLECTOMY    . TONSILLECTOMY AND ADENOIDECTOMY      Current Outpatient Medications  Medication Sig Dispense Refill  . acetaminophen  (TYLENOL) 325 MG tablet Take 650 mg by mouth daily.    [name] aspirin 81 MG chewable tablet Chew 1 tablet (81 mg total) by mouth 2 (two) times daily. (Patient taking differently: Chew 81 mg by mouth as needed. ) 60 tablet 1  . BIOTIN 5000 PO Take by mouth daily.    . Calcium Citrate-Vitamin D (CALCIUM + D PO) Takes 1200mg  - 1000 IU tablet daily.     . Cholecalciferol (VITAMIN D3) 5000 units TABS Take 5,000 Units by mouth daily.     . Cyanocobalamin (VITAMIN B-12) 5000 MCG TBDP Take 5,000 mcg by mouth daily.    . diclofenac sodium (VOLTAREN) 1 % GEL Apply topically. 2-3 times a day to feet    . docusate sodium (COLACE) 100 MG capsule Take 1 capsule (100 mg total) by mouth 2 (two) times daily. (Patient taking differently: Take 100 mg by mouth daily as needed. ) 60 capsule 1  . furosemide (LASIX) 20 MG tablet Take 20 mg by mouth daily.     [name] gabapentin (NEURONTIN) 800 MG tablet Take 1 tablet (800 mg total) by mouth 3 (three) times daily. 270 tablet 4  . hydrochlorothiazide (HYDRODIURIL) 25 MG tablet Take 25 mg by mouth daily.     . hydroxychloroquine (PLAQUENIL) 200 MG tablet Take 200 mg by mouth 2 (two) times daily.    [name] losartan (COZAAR) 50 MG tablet Take 50 mg  by mouth daily.     . magnesium oxide (MAG-OX) 400 MG tablet Take 400 mg by mouth every evening.    [name] omeprazole (PRILOSEC) 20 MG capsule Take 20 mg by mouth daily.     [name] OVER THE COUNTER MEDICATION Take 2 tablets by mouth daily. Med Name: Juice + [employer] supplement daily.    . ranitidine (ZANTAC) 150 MG tablet Take 150 mg by mouth as needed for heartburn.    [name] UNABLE TO FIND Take 2 tablets by mouth daily. Med Name: Juice + [employer] supplement daily.      No current facility-administered medications for this visit.     Allergies as of 10/03/2017 - Review Complete 10/03/2017  Allergen Reaction Noted  . No known allergies  12/15/2016    Family History  Problem Relation Age of Onset  . Stroke Mother   . COPD Father   . Cancer  Father        Unsure of type - "on his chin"  . Throat cancer Sister   . Stomach cancer Brother   . Breast cancer Sister   . Colon cancer Neg Hx     Social History   Socioeconomic History  . Marital status: Married    Spouse name: Not on file  . Number of children: 0  . Years of education: 9  . Highest education level: Not on file  Social Needs  . Financial resource strain: Not on file  . Food insecurity - worry: Not on file  . Food insecurity - inability: Not on file  . Transportation needs - medical: Not on file  . Transportation needs - non-medical: Not on file  Occupational History  . Occupation: Retired  Tobacco Use  . Smoking status: Never Smoker  . Smokeless tobacco: Never Used  Substance and Sexual Activity  . Alcohol use: Yes    Comment: 2 glasses three times weekly.  . Drug use: No  . Sexual activity: Not on file  Other Topics Concern  . Not on file  Social History Narrative   Lives at home with husband.   Left-handed.   4-5 cups caffeine daily.    Review of Systems: Gen: Denies any fever, chills, fatigue, weight loss, lack of appetite.  CV: Denies chest pain, heart palpitations, peripheral edema, syncope.  Resp: Denies shortness of breath at rest or with exertion. Denies wheezing or cough.  GI: see HPI  GU : Denies urinary burning, urinary frequency, urinary hesitancy MS: Denies joint pain, muscle weakness, cramps, or limitation of movement.  Derm: Denies rash, itching, dry skin Psych: Denies depression, anxiety, memory loss, and confusion Heme: Denies bruising, bleeding, and enlarged lymph nodes.  Physical Exam: BP 122/71   Pulse 82   Temp (!) 96.8 F (36 C) (Oral)   Ht 5\' 4"  (1.626 m)   Wt 152 lb 9.6 oz (69.2 kg)   BMI 26.19 kg/m  General:   Alert and oriented. Pleasant and cooperative. Well-nourished and well-developed.  Head:  Normocephalic and atraumatic. Eyes:  Without icterus, sclera clear and conjunctiva pink.  Ears:  Normal auditory  acuity. Nose:  No deformity, discharge,  or lesions. Mouth:  No deformity or lesions, oral mucosa pink.  Lungs:  Clear to auscultation bilaterally. No wheezes, rales, or rhonchi. No distress.  Heart:  S1, S2 present without murmurs appreciated.  Abdomen:  +BS, soft, non-tender and non-distended. No HSM noted. No guarding or rebound. No masses appreciated.  Rectal:  Deferred  Msk:  Symmetrical without  gross deformities. Normal posture. Extremities:  Without edema. Neurologic:  Alert and  oriented x Psych:  Alert and cooperative. Normal mood and affect.

## 2017-10-03 NOTE — Assessment & Plan Note (Signed)
72 year old female with history of adenomas, last in 2013 by Dr. Algis Greenhouse. Due for routine surveillance now. No concerning lower GI signs/symptoms. Last procedure done with Propofol.  Proceed with TCS with Dr. Gala Romney in near future: the risks, benefits, and alternatives have been discussed with the patient in detail. The patient states understanding and desires to proceed. Propofol due to polypharmacy

## 2017-10-04 ENCOUNTER — Telehealth: Payer: Self-pay | Admitting: *Deleted

## 2017-10-04 ENCOUNTER — Encounter: Payer: Self-pay | Admitting: *Deleted

## 2017-10-04 NOTE — Progress Notes (Signed)
CC'D TO PCP °

## 2017-10-04 NOTE — Telephone Encounter (Signed)
LMOVM. Pre-op scheduled for 10/26/17 at 11:00am. Letter mailed to pt

## 2017-10-24 NOTE — Patient Instructions (Signed)
Natalie Page  10/24/2017     @PREFPERIOPPHARMACY @   Your procedure is scheduled on  11/01/2017   Report to [facility] at  900   A.M.  Call this number if you have problems the morning of surgery:  [phone]   Remember:  Do not eat food or drink liquids after midnight.  Take these medicines the morning of surgery with A SIP OF WATER  Neurontin, cozaar, prilosec, zantac.   Do not wear jewelry, make-up or nail polish.  Do not wear lotions, powders, or perfumes, or deodorant.  Do not shave 48 hours prior to surgery.  Men may shave face and neck.  Do not bring valuables to the hospital.  [facility] is not responsible for any belongings or valuables.  Contacts, dentures or bridgework may not be worn into surgery.  Leave your suitcase in the car.  After surgery it may be brought to your room.  For patients admitted to the hospital, discharge time will be determined by your treatment team.  Patients discharged the day of surgery will not be allowed to drive home.   Name and phone number of your driver:   family Special instructions:  Follow the diet and prep instructions given to you by Dr [name] office.  Please read over the following fact sheets that you were given. Anesthesia Post-op Instructions and Care and Recovery After Surgery       Colonoscopy, Adult A colonoscopy is an exam to look at the large intestine. It is done to check for problems, such as:  Lumps (tumors).  Growths (polyps).  Swelling (inflammation).  Bleeding.  What happens before the procedure? Eating and drinking Follow instructions from your doctor about eating and drinking. These instructions may include:  A few days before the procedure - follow a low-fiber diet. ? Avoid nuts. ? Avoid seeds. ? Avoid dried fruit. ? Avoid raw fruits. ? Avoid vegetables.  1-3 days before the procedure - follow a clear liquid diet. Avoid liquids that have red or purple dye. Drink only  clear liquids, such as: ? Clear broth or bouillon. ? Black coffee or tea. ? Clear juice. ? Clear soft drinks or sports drinks. ? Gelatin dessert. ? Popsicles.  On the day of the procedure - do not eat or drink anything during the 2 hours before the procedure.  Bowel prep If you were prescribed an oral bowel prep:  Take it as told by your doctor. Starting the day before your procedure, you will need to drink a lot of liquid. The liquid will cause you to poop (have bowel movements) until your poop is almost clear or light green.  If your skin or butt gets irritated from diarrhea, you may: ? Wipe the area with wipes that have medicine in them, such as adult wet wipes with aloe and vitamin E. ? Put something on your skin that soothes the area, such as petroleum jelly.  If you throw up (vomit) while drinking the bowel prep, take a break for up to 60 minutes. Then begin the bowel prep again. If you keep throwing up and you cannot take the bowel prep without throwing up, call your doctor.  General instructions  Ask your doctor about changing or stopping your normal medicines. This is important if you take diabetes medicines or blood thinners.  Plan to have someone take you home from the hospital or clinic. What happens during the procedure?  An IV  tube may be put into one of your veins.  You will be given medicine to help you relax (sedative).  To reduce your risk of infection: ? Your doctors will wash their hands. ? Your anal area will be washed with soap.  You will be asked to lie on your side with your knees bent.  Your doctor will get a long, thin, flexible tube ready. The tube will have a camera and a light on the end.  The tube will be put into your anus.  The tube will be gently put into your large intestine.  Air will be delivered into your large intestine to keep it open. You may feel some pressure or cramping.  The camera will be used to take photos.  A small tissue  sample may be removed from your body to be looked at under a microscope (biopsy). If any possible problems are found, the tissue will be sent to a lab for testing.  If small growths are found, your doctor may remove them and have them checked for cancer.  The tube that was put into your anus will be slowly removed. The procedure may vary among doctors and hospitals. What happens after the procedure?  Your doctor will check on you often until the medicines you were given have worn off.  Do not drive for 24 hours after the procedure.  You may have a small amount of blood in your poop.  You may pass gas.  You may have mild cramps or bloating in your belly (abdomen).  It is up to you to get the results of your procedure. Ask your doctor, or the department performing the procedure, when your results will be ready. This information is not intended to replace advice given to you by your health care provider. Make sure you discuss any questions you have with your health care provider. Document Released: 09/09/2010 Document Revised: 06/07/2016 Document Reviewed: 10/19/2015 Elsevier Interactive Patient Education  2017 [employer]  Colonoscopy, Adult, Care After This sheet gives you information about how to care for yourself after your procedure. Your health care provider may also give you more specific instructions. If you have problems or questions, contact your health care provider. What can I expect after the procedure? After the procedure, it is common to have:  A small amount of blood in your stool for 24 hours after the procedure.  Some gas.  Mild abdominal cramping or bloating.  Follow these instructions at home: General instructions   For the first 24 hours after the procedure: ? Do not drive or use machinery. ? Do not sign important documents. ? Do not drink alcohol. ? Do your regular daily activities at a slower pace than normal. ? Eat soft, easy-to-digest foods. ? Rest  often.  Take over-the-counter or prescription medicines only as told by your health care provider.  It is up to you to get the results of your procedure. Ask your health care provider, or the department performing the procedure, when your results will be ready. Relieving cramping and bloating  Try walking around when you have cramps or feel bloated.  Apply heat to your abdomen as told by your health care provider. Use a heat source that your health care provider recommends, such as a moist heat pack or a heating pad. ? Place a towel between your skin and the heat source. ? Leave the heat on for 20-30 minutes. ? Remove the heat if your skin turns bright red. This is especially important  if you are unable to feel pain, heat, or cold. You may have a greater risk of getting burned. Eating and drinking  Drink enough fluid to keep your urine clear or pale yellow.  Resume your normal diet as instructed by your health care provider. Avoid heavy or fried foods that are hard to digest.  Avoid drinking alcohol for as long as instructed by your health care provider. Contact a health care provider if:  You have blood in your stool 2-3 days after the procedure. Get help right away if:  You have more than a small spotting of blood in your stool.  You pass large blood clots in your stool.  Your abdomen is swollen.  You have nausea or vomiting.  You have a fever.  You have increasing abdominal pain that is not relieved with medicine. This information is not intended to replace advice given to you by your health care provider. Make sure you discuss any questions you have with your health care provider. Document Released: 03/21/2004 Document Revised: 05/01/2016 Document Reviewed: 10/19/2015 Elsevier Interactive Patient Education  2018 [place] Anesthesia is a term that refers to techniques, procedures, and medicines that help a person stay safe and comfortable  during a medical procedure. Monitored anesthesia care, or sedation, is one type of anesthesia. Your anesthesia specialist may recommend sedation if you will be having a procedure that does not require you to be unconscious, such as:  Cataract surgery.  A dental procedure.  A biopsy.  A colonoscopy.  During the procedure, you may receive a medicine to help you relax (sedative). There are three levels of sedation:  Mild sedation. At this level, you may feel awake and relaxed. You will be able to follow directions.  Moderate sedation. At this level, you will be sleepy. You may not remember the procedure.  Deep sedation. At this level, you will be asleep. You will not remember the procedure.  The more medicine you are given, the deeper your level of sedation will be. Depending on how you respond to the procedure, the anesthesia specialist may change your level of sedation or the type of anesthesia to fit your needs. An anesthesia specialist will monitor you closely during the procedure. Let your health care provider know about:  Any allergies you have.  All medicines you are taking, including vitamins, herbs, eye drops, creams, and over-the-counter medicines.  Any use of steroids (by mouth or as a cream).  Any problems you or family members have had with sedatives and anesthetic medicines.  Any blood disorders you have.  Any surgeries you have had.  Any medical conditions you have, such as sleep apnea.  Whether you are pregnant or may be pregnant.  Any use of cigarettes, alcohol, or street drugs. What are the risks? Generally, this is a safe procedure. However, problems may occur, including:  Getting too much medicine (oversedation).  Nausea.  Allergic reaction to medicines.  Trouble breathing. If this happens, a breathing tube may be used to help with breathing. It will be removed when you are awake and breathing on your own.  Heart trouble.  Lung trouble.  Before  the procedure Staying hydrated Follow instructions from your health care provider about hydration, which may include:  Up to 2 hours before the procedure - you may continue to drink clear liquids, such as water, clear fruit juice, black coffee, and plain tea.  Eating and drinking restrictions Follow instructions from your health care provider about eating  and drinking, which may include:  8 hours before the procedure - stop eating heavy meals or foods such as meat, fried foods, or fatty foods.  6 hours before the procedure - stop eating light meals or foods, such as toast or cereal.  6 hours before the procedure - stop drinking milk or drinks that contain milk.  2 hours before the procedure - stop drinking clear liquids.  Medicines Ask your health care provider about:  Changing or stopping your regular medicines. This is especially important if you are taking diabetes medicines or blood thinners.  Taking medicines such as aspirin and ibuprofen. These medicines can thin your blood. Do not take these medicines before your procedure if your health care provider instructs you not to.  Tests and exams  You will have a physical exam.  You may have blood tests done to show: ? How well your kidneys and liver are working. ? How well your blood can clot.  General instructions  Plan to have someone take you home from the hospital or clinic.  If you will be going home right after the procedure, plan to have someone with you for 24 hours.  What happens during the procedure?  Your blood pressure, heart rate, breathing, level of pain and overall condition will be monitored.  An IV tube will be inserted into one of your veins.  Your anesthesia specialist will give you medicines as needed to keep you comfortable during the procedure. This may mean changing the level of sedation.  The procedure will be performed. After the procedure  Your blood pressure, heart rate, breathing rate, and  blood oxygen level will be monitored until the medicines you were given have worn off.  Do not drive for 24 hours if you received a sedative.  You may: ? Feel sleepy, clumsy, or nauseous. ? Feel forgetful about what happened after the procedure. ? Have a sore throat if you had a breathing tube during the procedure. ? Vomit. This information is not intended to replace advice given to you by your health care provider. Make sure you discuss any questions you have with your health care provider. Document Released: 05/03/2005 Document Revised: 01/14/2016 Document Reviewed: 11/28/2015 Elsevier Interactive Patient Education  2018 [place], Care After These instructions provide you with information about caring for yourself after your procedure. Your health care provider may also give you more specific instructions. Your treatment has been planned according to current medical practices, but problems sometimes occur. Call your health care provider if you have any problems or questions after your procedure. What can I expect after the procedure? After your procedure, it is common to:  Feel sleepy for several hours.  Feel clumsy and have poor balance for several hours.  Feel forgetful about what happened after the procedure.  Have poor judgment for several hours.  Feel nauseous or vomit.  Have a sore throat if you had a breathing tube during the procedure.  Follow these instructions at home: For at least 24 hours after the procedure:   Do not: ? Participate in activities in which you could fall or become injured. ? Drive. ? Use heavy machinery. ? Drink alcohol. ? Take sleeping pills or medicines that cause drowsiness. ? Make important decisions or sign legal documents. ? Take care of children on your own.  Rest. Eating and drinking  Follow the diet that is recommended by your health care provider.  If you vomit, drink water, juice, or soup when  you  can drink without vomiting.  Make sure you have little or no nausea before eating solid foods. General instructions  Have a responsible adult stay with you until you are awake and alert.  Take over-the-counter and prescription medicines only as told by your health care provider.  If you smoke, do not smoke without supervision.  Keep all follow-up visits as told by your health care provider. This is important. Contact a health care provider if:  You keep feeling nauseous or you keep vomiting.  You feel light-headed.  You develop a rash.  You have a fever. Get help right away if:  You have trouble breathing. This information is not intended to replace advice given to you by your health care provider. Make sure you discuss any questions you have with your health care provider. Document Released: 11/28/2015 Document Revised: 03/29/2016 Document Reviewed: 11/28/2015 Elsevier Interactive Patient Education  [employer].

## 2017-10-26 ENCOUNTER — Encounter (HOSPITAL_COMMUNITY)
Admission: RE | Admit: 2017-10-26 | Discharge: 2017-10-26 | Disposition: A | Discharge status: Home / Self Care | Payer: Medicare Other | Source: Ambulatory Visit | Attending: Internal Medicine | Admitting: Internal Medicine

## 2017-10-26 ENCOUNTER — Other Ambulatory Visit: Payer: Self-pay

## 2017-10-26 ENCOUNTER — Encounter (HOSPITAL_COMMUNITY): Payer: Self-pay

## 2017-10-26 DIAGNOSIS — Z01810 Encounter for preprocedural cardiovascular examination: Secondary | ICD-10-CM | POA: Diagnosis not present

## 2017-10-26 DIAGNOSIS — Z86010 Personal history of colonic polyps: Secondary | ICD-10-CM | POA: Diagnosis not present

## 2017-10-26 DIAGNOSIS — Z01818 Encounter for other preprocedural examination: Secondary | ICD-10-CM | POA: Insufficient documentation

## 2017-10-26 LAB — CBC WITH DIFFERENTIAL/PLATELET
BASOS ABS: 0 10*3/uL (ref 0.0–0.1)
Basophils Relative: 0 %
EOS ABS: 0.1 10*3/uL (ref 0.0–0.7)
EOS PCT: 3 %
HCT: 39.7 % (ref 36.0–46.0)
Hemoglobin: 12.4 g/dL (ref 12.0–15.0)
Lymphocytes Relative: 19 %
Lymphs Abs: 1.1 10*3/uL (ref 0.7–4.0)
MCH: 27.6 pg (ref 26.0–34.0)
MCHC: 31.2 g/dL (ref 30.0–36.0)
MCV: 88.2 fL (ref 78.0–100.0)
MONO ABS: 0.3 10*3/uL (ref 0.1–1.0)
Monocytes Relative: 6 %
Neutro Abs: 4 10*3/uL (ref 1.7–7.7)
Neutrophils Relative %: 72 %
PLATELETS: 222 10*3/uL (ref 150–400)
RBC: 4.5 MIL/uL (ref 3.87–5.11)
RDW: 14.1 % (ref 11.5–15.5)
WBC: 5.6 10*3/uL (ref 4.0–10.5)

## 2017-10-26 LAB — BASIC METABOLIC PANEL
ANION GAP: 11 (ref 5–15)
BUN: 12 mg/dL (ref 6–20)
CALCIUM: 9.3 mg/dL (ref 8.9–10.3)
CO2: 28 mmol/L (ref 22–32)
Chloride: 98 mmol/L — ABNORMAL LOW (ref 101–111)
Creatinine, Ser: 0.68 mg/dL (ref 0.44–1.00)
GFR calc Af Amer: >60 mL/min (ref >60)
Glucose, Bld: 85 mg/dL (ref 65–99)
POTASSIUM: 3.4 mmol/L — AB (ref 3.5–5.1)
SODIUM: 137 mmol/L (ref 135–145)

## 2017-11-01 ENCOUNTER — Ambulatory Visit (HOSPITAL_COMMUNITY): Payer: Medicare Other | Admitting: Certified Registered"

## 2017-11-01 ENCOUNTER — Encounter (HOSPITAL_COMMUNITY): Payer: Self-pay

## 2017-11-01 ENCOUNTER — Encounter (HOSPITAL_COMMUNITY)
Admission: RE | Disposition: A | Discharge status: Home / Self Care | Payer: Self-pay | Source: Ambulatory Visit | Attending: Internal Medicine

## 2017-11-01 ENCOUNTER — Ambulatory Visit (HOSPITAL_COMMUNITY)
Admission: RE | Admit: 2017-11-01 | Discharge: 2017-11-01 | Disposition: A | Discharge status: Home / Self Care | Payer: Medicare Other | Source: Ambulatory Visit | Attending: Internal Medicine | Admitting: Internal Medicine

## 2017-11-01 DIAGNOSIS — Z86010 Personal history of colonic polyps: Secondary | ICD-10-CM | POA: Diagnosis not present

## 2017-11-01 DIAGNOSIS — K640 First degree hemorrhoids: Secondary | ICD-10-CM | POA: Insufficient documentation

## 2017-11-01 DIAGNOSIS — I10 Essential (primary) hypertension: Secondary | ICD-10-CM | POA: Diagnosis not present

## 2017-11-01 DIAGNOSIS — K219 Gastro-esophageal reflux disease without esophagitis: Secondary | ICD-10-CM | POA: Insufficient documentation

## 2017-11-01 DIAGNOSIS — Z1211 Encounter for screening for malignant neoplasm of colon: Secondary | ICD-10-CM | POA: Insufficient documentation

## 2017-11-01 DIAGNOSIS — Z7982 Long term (current) use of aspirin: Secondary | ICD-10-CM | POA: Diagnosis not present

## 2017-11-01 DIAGNOSIS — M8580 Other specified disorders of bone density and structure, unspecified site: Secondary | ICD-10-CM | POA: Insufficient documentation

## 2017-11-01 DIAGNOSIS — G629 Polyneuropathy, unspecified: Secondary | ICD-10-CM | POA: Diagnosis not present

## 2017-11-01 DIAGNOSIS — K5730 Diverticulosis of large intestine without perforation or abscess without bleeding: Secondary | ICD-10-CM | POA: Insufficient documentation

## 2017-11-01 DIAGNOSIS — Z79899 Other long term (current) drug therapy: Secondary | ICD-10-CM | POA: Diagnosis not present

## 2017-11-01 HISTORY — PX: COLONOSCOPY WITH PROPOFOL: SHX5780

## 2017-11-01 SURGERY — COLONOSCOPY WITH PROPOFOL
Anesthesia: Monitor Anesthesia Care

## 2017-11-01 MED ORDER — FENTANYL CITRATE (PF) 100 MCG/2ML IJ SOLN
25.0000 ug | Freq: Once | INTRAMUSCULAR | Status: AC
  Administered 2017-11-01: 25 ug via INTRAVENOUS

## 2017-11-01 MED ORDER — PROPOFOL 500 MG/50ML IV EMUL
INTRAVENOUS | Status: DC | PRN
  Administered 2017-11-01: 100 ug/kg/min via INTRAVENOUS

## 2017-11-01 MED ORDER — PROPOFOL 10 MG/ML IV BOLUS
INTRAVENOUS | Status: DC | PRN
  Administered 2017-11-01: 30 mg via INTRAVENOUS

## 2017-11-01 MED ORDER — LIDOCAINE HCL (PF) 1 % IJ SOLN
INTRAMUSCULAR | Status: DC | PRN
  Administered 2017-11-01: 20 mg

## 2017-11-01 MED ORDER — FENTANYL CITRATE (PF) 100 MCG/2ML IJ SOLN
INTRAMUSCULAR | Status: AC
  Filled 2017-11-01: qty 2

## 2017-11-01 MED ORDER — MIDAZOLAM HCL 2 MG/2ML IJ SOLN
1.0000 mg | INTRAMUSCULAR | Status: AC
  Administered 2017-11-01: 2 mg via INTRAVENOUS

## 2017-11-01 MED ORDER — PROPOFOL 10 MG/ML IV BOLUS
INTRAVENOUS | Status: AC
  Filled 2017-11-01: qty 20

## 2017-11-01 MED ORDER — CHLORHEXIDINE GLUCONATE CLOTH 2 % EX PADS: 6.0000 | MEDICATED_PAD | Freq: Once | CUTANEOUS | Status: DC

## 2017-11-01 MED ORDER — MIDAZOLAM HCL 2 MG/2ML IJ SOLN
INTRAMUSCULAR | Status: AC
  Filled 2017-11-01: qty 2

## 2017-11-01 MED ORDER — LACTATED RINGERS IV SOLN
INTRAVENOUS | Status: DC
  Administered 2017-11-01: 11:00:00 via INTRAVENOUS

## 2017-11-01 NOTE — Anesthesia Postprocedure Evaluation (Signed)
Anesthesia Post Note  Patient: Natalie Page  Procedure(s) Performed: COLONOSCOPY WITH PROPOFOL (N/A )  Patient location during evaluation: PACU Anesthesia Type: MAC Level of consciousness: awake and alert, oriented and patient cooperative Pain management: satisfactory to patient Vital Signs Assessment: post-procedure vital signs reviewed and stable Respiratory status: spontaneous breathing, nonlabored ventilation and respiratory function stable Cardiovascular status: blood pressure returned to baseline and stable Postop Assessment: no headache, adequate PO intake and no apparent nausea or vomiting Anesthetic complications: no     Last Vitals:  Vitals:   11/01/17 1135 11/01/17 1202  BP: (!) 143/70 128/63  Pulse:  (!) 138  Resp: 15 15  Temp:  (P) 37.1 C  SpO2: 99% (!) 76%    Last Pain:  Vitals:   11/01/17 0914  TempSrc: Oral                 Drucella Karbowski E Flossie Buffy

## 2017-11-01 NOTE — Discharge Instructions (Signed)
Colonoscopy Discharge Instructions  Read the instructions outlined below and refer to this sheet in the next few weeks. These discharge instructions provide you with general information on caring for yourself after you leave the hospital. Your doctor may also give you specific instructions. While your treatment has been planned according to the most current medical practices available, unavoidable complications occasionally occur. If you have any problems or questions after discharge, call Dr. Gala Romney at [phone]. ACTIVITY  You may resume your regular activity, but move at a slower pace for the next 24 hours.   Take frequent rest periods for the next 24 hours.   Walking will help get rid of the air and reduce the bloated feeling in your belly (abdomen).   No driving for 24 hours (because of the medicine (anesthesia) used during the test).    Do not sign any important legal documents or operate any machinery for 24 hours (because of the anesthesia used during the test).  NUTRITION  Drink plenty of fluids.   You may resume your normal diet as instructed by your doctor.   Begin with a light meal and progress to your normal diet. Heavy or fried foods are harder to digest and may make you feel sick to your stomach (nauseated).   Avoid alcoholic beverages for 24 hours or as instructed.  MEDICATIONS  You may resume your normal medications unless your doctor tells you otherwise.  WHAT YOU CAN EXPECT TODAY  Some feelings of bloating in the abdomen.   Passage of more gas than usual.   Spotting of blood in your stool or on the toilet paper.  IF YOU HAD POLYPS REMOVED DURING THE COLONOSCOPY:  No aspirin products for 7 days or as instructed.   No alcohol for 7 days or as instructed.   Eat a soft diet for the next 24 hours.  FINDING OUT THE RESULTS OF YOUR TEST Not all test results are available during your visit. If your test results are not back during the visit, make an appointment  with your caregiver to find out the results. Do not assume everything is normal if you have not heard from your caregiver or the medical facility. It is important for you to follow up on all of your test results.  SEEK IMMEDIATE MEDICAL ATTENTION IF:  You have more than a spotting of blood in your stool.   Your belly is swollen (abdominal distention).   You are nauseated or vomiting.   You have a temperature over 101.   You have abdominal pain or discomfort that is severe or gets worse throughout the day.     Diverticulosis  information provided  I do not recommend a future colonoscopy unless new symptoms develop.    Diverticulosis Diverticulosis is a condition that develops when small pouches (diverticula) form in the wall of the large intestine (colon). The colon is where water is absorbed and stool is formed. The pouches form when the inside layer of the colon pushes through weak spots in the outer layers of the colon. You may have a few pouches or many of them. What are the causes? The cause of this condition is not known. What increases the risk? The following factors may make you more likely to develop this condition:  Being older than age 60. Your risk for this condition increases with age. Diverticulosis is rare among people younger than age 77. By age 24, many people have it.  Eating a low-fiber diet.  Having frequent constipation.  Being overweight.  Not getting enough exercise.  Smoking.  Taking over-the-counter pain medicines, like aspirin and ibuprofen.  Having a family history of diverticulosis.  What are the signs or symptoms? In most people, there are no symptoms of this condition. If you do have symptoms, they may include:  Bloating.  Cramps in the abdomen.  Constipation or diarrhea.  Pain in the lower left side of the abdomen.  How is this diagnosed? This condition is most often diagnosed during an exam for other colon problems. Because  diverticulosis usually has no symptoms, it often cannot be diagnosed independently. This condition may be diagnosed by:  Using a flexible scope to examine the colon (colonoscopy).  Taking an X-ray of the colon after dye has been put into the colon (barium enema).  Doing a CT scan.  How is this treated? You may not need treatment for this condition if you have never developed an infection related to diverticulosis. If you have had an infection before, treatment may include:  Eating a high-fiber diet. This may include eating more fruits, vegetables, and grains.  Taking a fiber supplement.  Taking a live bacteria supplement (probiotic).  Taking medicine to relax your colon.  Taking antibiotic medicines.  Follow these instructions at home:  Drink 6-8 glasses of water or more each day to prevent constipation.  Try not to strain when you have a bowel movement.  If you have had an infection before: ? Eat more fiber as directed by your health care provider or your diet and nutrition specialist (dietitian). ? Take a fiber supplement or probiotic, if your health care provider approves.  Take over-the-counter and prescription medicines only as told by your health care provider.  If you were prescribed an antibiotic, take it as told by your health care provider. Do not stop taking the antibiotic even if you start to feel better.  Keep all follow-up visits as told by your health care provider. This is important. Contact a health care provider if:  You have pain in your abdomen.  You have bloating.  You have cramps.  You have not had a bowel movement in 3 days. Get help right away if:  Your pain gets worse.  Your bloating becomes very bad.  You have a fever or chills, and your symptoms suddenly get worse.  You vomit.  You have bowel movements that are bloody or black.  You have bleeding from your rectum. Summary  Diverticulosis is a condition that develops when small  pouches (diverticula) form in the wall of the large intestine (colon).  You may have a few pouches or many of them.  This condition is most often diagnosed during an exam for other colon problems.  If you have had an infection related to diverticulosis, treatment may include increasing the fiber in your diet, taking supplements, or taking medicines. This information is not intended to replace advice given to you by your health care provider. Make sure you discuss any questions you have with your health care provider. Document Released: 05/04/2004 Document Revised: 06/26/2016 Document Reviewed: 06/26/2016 Elsevier Interactive Patient Education  2017 Elsevier Inc.    PATIENT INSTRUCTIONS POST-ANESTHESIA  IMMEDIATELY FOLLOWING SURGERY:  Do not drive or operate machinery for the first twenty four hours after surgery.  Do not make any important decisions for twenty four hours after surgery or while taking narcotic pain medications or sedatives.  If you develop intractable nausea and vomiting or a severe headache please notify your doctor immediately.  FOLLOW-UP:  Please make an appointment with your surgeon as instructed. You do not need to follow up with anesthesia unless specifically instructed to do so.  WOUND CARE INSTRUCTIONS (if applicable):  Keep a dry clean dressing on the anesthesia/puncture wound site if there is drainage.  Once the wound has quit draining you may leave it open to air.  Generally you should leave the bandage intact for twenty four hours unless there is drainage.  If the epidural site drains for more than 36-48 hours please call the anesthesia department.  QUESTIONS?:  Please feel free to call your physician or the hospital operator if you have any questions, and they will be happy to assist you.

## 2017-11-01 NOTE — Transfer of Care (Signed)
Immediate Anesthesia Transfer of Care Note  Patient: Natalie Page  Procedure(s) Performed: COLONOSCOPY WITH PROPOFOL (N/A )  Patient Location: PACU  Anesthesia Type:MAC  Level of Consciousness: awake, alert , oriented and patient cooperative  Airway & Oxygen Therapy: Patient Spontanous Breathing  Post-op Assessment: Report given to RN, Post -op Vital signs reviewed and stable and Patient moving all extremities X 4  Post vital signs: Reviewed and stable  Last Vitals:  Vitals:   11/01/17 1130 11/01/17 1135  BP: 130/61 (!) 143/70  Pulse:    Resp: 17 15  Temp:    SpO2: 97% 99%    Last Pain:  Vitals:   11/01/17 0914  TempSrc: Oral         Complications: No apparent anesthesia complications

## 2017-11-01 NOTE — Interval H&P Note (Signed)
History and Physical Interval Note:  11/01/2017 11:30 AM  Natalie Page  has presented today for surgery, with the diagnosis of h/o colon polyps  The various methods of treatment have been discussed with the patient and family. After consideration of risks, benefits and other options for treatment, the patient has consented to  Procedure(s) with comments: COLONOSCOPY WITH PROPOFOL (N/A) - 12:30pm as a surgical intervention .  The patient's history has been reviewed, patient examined, no change in status, stable for surgery.  I have reviewed the patient's chart and labs.  Questions were answered to the patient's satisfaction.     Robert Rourk  No change. Surveillance colonoscopy per plan.  The risks, benefits, limitations, alternatives and imponderables have been reviewed with the patient. Questions have been answered. All parties are agreeable.

## 2017-11-01 NOTE — Op Note (Signed)
[facility] Patient Name: Natalie Page Procedure Date: 11/01/2017 11:26 AM MRN: [id number] Date of Birth: 04-20-1946 Attending MD: Natalie Page , MD CSN: [id number] Age: 72 Admit Type: Outpatient Procedure:                Colonoscopy Indications:              Screening for colorectal malignant neoplasm, High                            risk colon cancer surveillance: Personal history of                            colonic polyps Providers:                Natalie Richards, MD, Otis Peak B. Sharon Seller, RN,                            Randa Spike, Technician Referring MD:              Medicines:                Propofol per Anesthesia Complications:            No immediate complications. Estimated Blood Loss:     Estimated blood loss: none. Procedure:                Pre-Anesthesia Assessment:                           - Prior to the procedure, a History and Physical                            was performed, and patient medications and                            allergies were reviewed. The patient's tolerance of                            previous anesthesia was also reviewed. The risks                            and benefits of the procedure and the sedation                            options and risks were discussed with the patient.                            All questions were answered, and informed consent                            was obtained. Prior Anticoagulants: The patient has                            taken no previous anticoagulant or antiplatelet                            agents.  ASA Grade Assessment: II - A patient with                            mild systemic disease. After reviewing the risks                            and benefits, the patient was deemed in                            satisfactory condition to undergo the procedure.                           After obtaining informed consent, the colonoscope                            was passed under  direct vision. Throughout the                            procedure, the patient's blood pressure, pulse, and                            oxygen saturations were monitored continuously. The                            EC-[id number] ([id number]) scope was introduced through                            the and advanced to the the cecum, identified by                            appendiceal orifice and ileocecal valve. The                            ileocecal valve, appendiceal orifice, and rectum                            were photographed. The quality of the bowel                            preparation was adequate. Scope In: 11:46:02 AM Scope Out: 11:57:13 AM Scope Withdrawal Time: 0 hours 6 minutes 35 seconds  Total Procedure Duration: 0 hours 11 minutes 11 seconds  Findings:      The perianal and digital rectal examinations were normal.      Multiple small and large-mouthed diverticula were found in the entire       colon.      Non-bleeding internal hemorrhoids / anal papilla were found during       retroflexion. The hemorrhoids were Grade I (internal hemorrhoids that do       not prolapse).      The exam was otherwise without abnormality on direct and retroflexion       views. Impression:               - Diverticulosis in the entire examined colon.                           -  Non-bleeding internal hemorrhoids.                           - The examination was otherwise normal on direct                            and retroflexion views.                           - No specimens collected. Moderate Sedation:      Moderate (conscious) sedation was personally administered by an       anesthesia professional. The following parameters were monitored: oxygen       saturation, heart rate, blood pressure, respiratory rate, EKG, adequacy       of pulmonary ventilation, and response to care. Total physician       intraservice time was 15 minutes. Recommendation:           - Patient has a contact number  available for                            emergencies. The signs and symptoms of potential                            delayed complications were discussed with the                            patient. Return to normal activities tomorrow.                            Written discharge instructions were provided to the                            patient.                           - Resume previous diet.                           - Continue present medications.                           - No repeat colonoscopy due to age.                           - Return to GI clinic PRN. Procedure Code(s):        --- Professional ---                           [phone], Colonoscopy, flexible; diagnostic, including                            collection of specimen(s) by brushing or washing,                            when performed (separate procedure) Diagnosis Code(s):        --- Professional ---  Z12.11, Encounter for screening for malignant                            neoplasm of colon                           Z86.010, Personal history of colonic polyps                           K64.0, First degree hemorrhoids                           K57.30, Diverticulosis of large intestine without                            perforation or abscess without bleeding CPT copyright 2016 American [employer] Association. All rights reserved. The codes documented in this report are preliminary and upon coder review may  be revised to meet current compliance requirements. Natalie Page. Natalie Cisek, MD Natalie Richards, MD 11/01/2017 12:04:48 PM This report has been signed electronically. Number of Addenda: 0

## 2017-11-01 NOTE — Anesthesia Preprocedure Evaluation (Signed)
Anesthesia Evaluation  Patient identified by MRN, date of birth, ID band Patient awake    Reviewed: Allergy & Precautions, H&P , NPO status , Patient's Chart, lab work & pertinent test results  Airway Mallampati: III  TM Distance: >3 FB Neck ROM: Full    Dental no notable dental hx. (+) Teeth Intact, Dental Advisory Given   Pulmonary neg pulmonary ROS,    Pulmonary exam normal breath sounds clear to auscultation       Cardiovascular hypertension, Pt. on medications + Peripheral Vascular Disease   Rhythm:Regular Rate:Normal     Neuro/Psych  Neuromuscular disease ( Peripheral neuropathy) negative psych ROS   GI/Hepatic Neg liver ROS, GERD  Controlled and Medicated,  Endo/Other  goiter  Renal/GU negative Renal ROS  negative genitourinary   Musculoskeletal  (+) Arthritis , Osteoarthritis,    Abdominal   Peds  Hematology negative hematology ROS (+)   Anesthesia Other Findings   Reproductive/Obstetrics negative OB ROS                             Anesthesia Physical Anesthesia Plan  ASA: III  Anesthesia Plan: MAC   Post-op Pain Management:    Induction: Intravenous  PONV Risk Score and Plan:   Airway Management Planned: Simple Face Mask  Additional Equipment:   Intra-op Plan:   Post-operative Plan:   Informed Consent: I have reviewed the patients History and Physical, chart, labs and discussed the procedure including the risks, benefits and alternatives for the proposed anesthesia with the patient or authorized representative who has indicated his/her understanding and acceptance.     Plan Discussed with:   Anesthesia Plan Comments:         Anesthesia Quick Evaluation

## 2017-11-07 ENCOUNTER — Encounter (HOSPITAL_COMMUNITY): Payer: Self-pay | Admitting: Internal Medicine

## 2017-11-13 DIAGNOSIS — Z789 Other specified health status: Secondary | ICD-10-CM | POA: Insufficient documentation

## 2017-11-13 DIAGNOSIS — Z79899 Other long term (current) drug therapy: Secondary | ICD-10-CM | POA: Insufficient documentation

## 2017-11-13 DIAGNOSIS — Z0000 Encounter for general adult medical examination without abnormal findings: Secondary | ICD-10-CM | POA: Insufficient documentation

## 2017-12-17 DIAGNOSIS — M25522 Pain in left elbow: Secondary | ICD-10-CM | POA: Insufficient documentation

## 2018-02-22 IMAGING — DX DG KNEE 1-2V PORT*L*
2 series · 2 of 2 positions shown · non-contrast
Comparison: None.

CLINICAL DATA: Status post left knee replacement today.

EXAM:
PORTABLE LEFT KNEE - 1-2 VIEW

[knee ap]
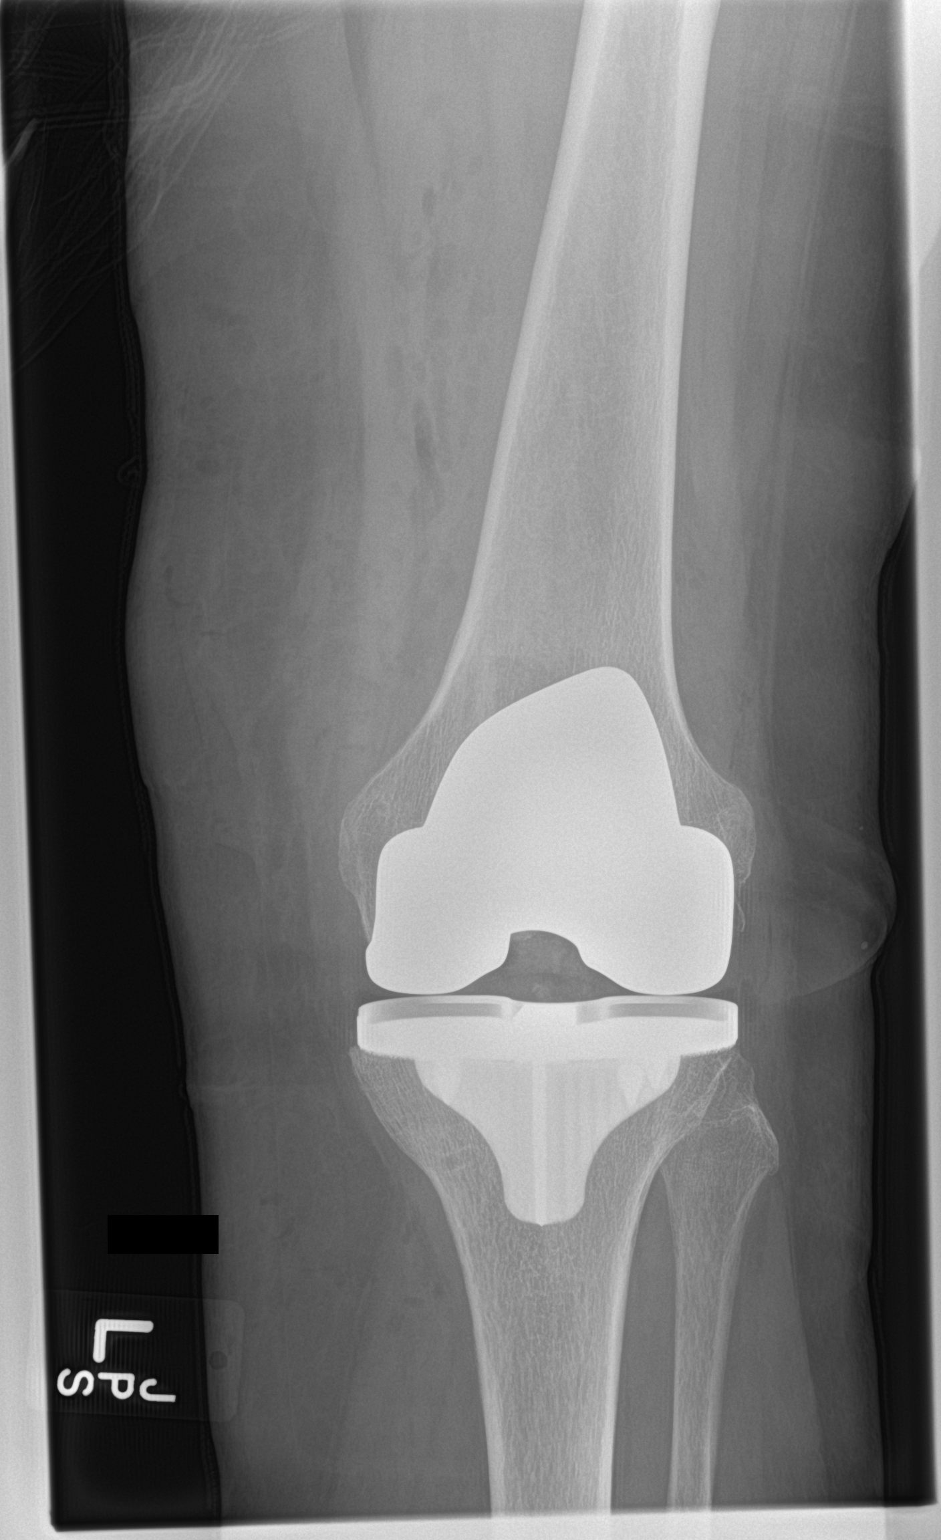

[knee lat]
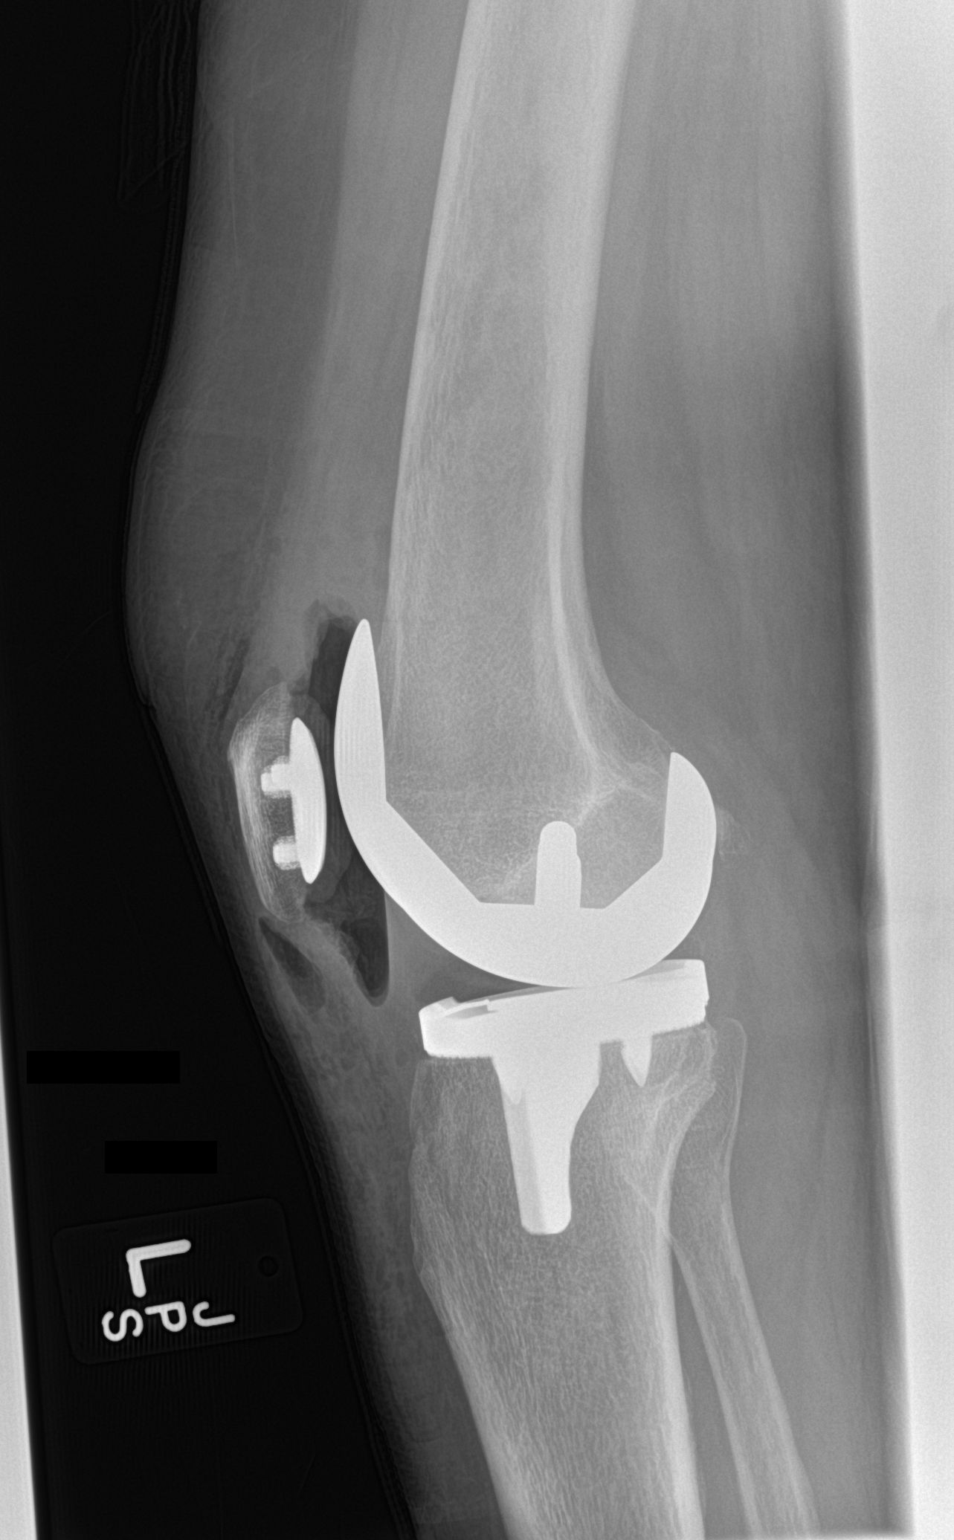

[2 of 2 positions shown; findings below may reference images not displayed]

FINDINGS: Left total knee arthroplasty is in place. Hardware is intact. There
is no fracture. Gas in the soft tissues from surgery noted.
IMPRESSION: Status post left total knee replacement.  No acute abnormality.

## 2018-05-09 DIAGNOSIS — E785 Hyperlipidemia, unspecified: Secondary | ICD-10-CM | POA: Insufficient documentation

## 2018-05-09 DIAGNOSIS — I6529 Occlusion and stenosis of unspecified carotid artery: Secondary | ICD-10-CM | POA: Insufficient documentation

## 2018-06-13 ENCOUNTER — Other Ambulatory Visit: Payer: Self-pay | Admitting: Specialist

## 2018-06-13 DIAGNOSIS — Z1231 Encounter for screening mammogram for malignant neoplasm of breast: Secondary | ICD-10-CM

## 2018-07-26 ENCOUNTER — Ambulatory Visit: Payer: Medicare Other

## 2018-07-26 ENCOUNTER — Ambulatory Visit
Admission: RE | Admit: 2018-07-26 | Discharge: 2018-07-26 | Disposition: A | Discharge status: Home / Self Care | Payer: Medicare Other | Source: Ambulatory Visit | Attending: Specialist | Admitting: Specialist

## 2018-07-26 DIAGNOSIS — Z1231 Encounter for screening mammogram for malignant neoplasm of breast: Secondary | ICD-10-CM

## 2018-08-06 DIAGNOSIS — Z68.41 Body mass index (BMI) pediatric, 5th percentile to less than 85th percentile for age: Secondary | ICD-10-CM | POA: Insufficient documentation

## 2018-08-26 ENCOUNTER — Ambulatory Visit (INDEPENDENT_AMBULATORY_CARE_PROVIDER_SITE_OTHER): Payer: Medicare Other

## 2018-08-26 ENCOUNTER — Encounter: Payer: Self-pay | Admitting: Podiatry

## 2018-08-26 ENCOUNTER — Ambulatory Visit (INDEPENDENT_AMBULATORY_CARE_PROVIDER_SITE_OTHER): Payer: Medicare Other | Admitting: Podiatry

## 2018-08-26 DIAGNOSIS — M2042 Other hammer toe(s) (acquired), left foot: Secondary | ICD-10-CM

## 2018-08-26 DIAGNOSIS — L84 Corns and callosities: Secondary | ICD-10-CM

## 2018-08-26 NOTE — Progress Notes (Signed)
   Subjective:    Patient ID: Natalie Page, female    DOB: [birth year], 73 y.o.   MRN: [id number]  HPI    Review of Systems  All other systems reviewed and are negative.      Objective:   Physical Exam        Assessment & Plan:

## 2018-09-02 NOTE — Progress Notes (Signed)
Subjective:   Patient ID: Natalie Page, female   DOB: 73 y.o.   MRN: [id number]   HPI Patient presents stating I am having a lot of pain between my big toe second toe on the left foot and it is making it hard for me to wear shoe gear comfortably or be active   Review of Systems  All other systems reviewed and are negative.       Objective:  Physical Exam Vitals signs and nursing note reviewed.  Constitutional:      Appearance: She is well-developed.  Pulmonary:     Effort: Pulmonary effort is normal.  Musculoskeletal: Normal range of motion.  Skin:    General: Skin is warm.  Neurological:     Mental Status: She is alert.     Neurovascular status intact with keratotic lesion between the hallux second toe left that is painful when pressed with abnormal positioning of the 2 toes creating irritation     Assessment:  Strong possibility that there is abnormal positioning of the hallux second digit creating chronic lesion formation secondary to structural change     Plan:  H&P and condition reviewed with patient.  At this point I recommended conservative trimming along with padding therapy with long-term consideration for digital correction or possible osteotomy of the hallux in order to straighten and take pressure off the second toe  X-ray indicates that there is abnormal positioning between the hallux and second digit with elevation of the toe noted of a moderate nature

## 2018-10-15 ENCOUNTER — Telehealth: Payer: Self-pay | Admitting: *Deleted

## 2018-10-15 NOTE — Telephone Encounter (Signed)
Pt states she had been seen by Dr. Paulla Dolly not too long ago, and her toe had gotten better, but is now tender and giving her problems and wanted to know if she should be seen or if he would send something to the pharmacy [facility], [address] [place], [place].

## 2018-10-15 NOTE — Telephone Encounter (Signed)
Left message informing pt I had reviewed her chart and saw that Dr. Paulla Dolly had performed conservative palliative care on the toes consisting of trimming and padding, but with the toes having abnormal positioning and her beginning to have a problem so soon after her appt, she may want to come in to discuss with Dr. Paulla Dolly a more long-term treatment.

## 2018-10-21 ENCOUNTER — Encounter: Payer: Self-pay | Admitting: Podiatry

## 2018-10-21 ENCOUNTER — Ambulatory Visit (INDEPENDENT_AMBULATORY_CARE_PROVIDER_SITE_OTHER): Payer: Medicare Other | Admitting: Podiatry

## 2018-10-21 DIAGNOSIS — M2042 Other hammer toe(s) (acquired), left foot: Secondary | ICD-10-CM | POA: Diagnosis not present

## 2018-10-21 NOTE — Patient Instructions (Signed)
Pre-Operative Instructions  Congratulations, you have decided to take an important step towards improving your quality of life.  You can be assured that the doctors and staff at [facility] will be with you every step of the way.  Here are some important things you should know:  1. Plan to be at the surgery center/hospital at least 1 (one) hour prior to your scheduled time, unless otherwise directed by the surgical center/hospital staff.  You must have a responsible adult accompany you, remain during the surgery and drive you home.  Make sure you have directions to the surgical center/hospital to ensure you arrive on time. 2. If you are having surgery at [facility] or [place] hospitals, you will need a copy of your medical history and physical form from your family physician within one month prior to the date of surgery. We will give you a form for your primary physician to complete.  3. We make every effort to accommodate the date you request for surgery.  However, there are times where surgery dates or times have to be moved.  We will contact you as soon as possible if a change in schedule is required.   4. No aspirin/ibuprofen for one week before surgery.  If you are on aspirin, any non-steroidal anti-inflammatory medications (Mobic, Aleve, Ibuprofen) should not be taken seven (7) days prior to your surgery.  You make take Tylenol for pain prior to surgery.  5. Medications - If you are taking daily heart and blood pressure medications, seizure, reflux, allergy, asthma, anxiety, pain or diabetes medications, make sure you notify the surgery center/hospital before the day of surgery so they can tell you which medications you should take or avoid the day of surgery. 6. No food or drink after midnight the night before surgery unless directed otherwise by surgical center/hospital staff. 7. No alcoholic beverages 24-hours prior to surgery.  No smoking 24-hours prior or 24-hours after  surgery. 8. Wear loose pants or shorts. They should be loose enough to fit over bandages, boots, and casts. 9. Don't wear slip-on shoes. Sneakers are preferred. 10. Bring your boot with you to the surgery center/hospital.  Also bring crutches or a walker if your physician has prescribed it for you.  If you do not have this equipment, it will be provided for you after surgery. 11. If you have not been contacted by the surgery center/hospital by the day before your surgery, call to confirm the date and time of your surgery. 12. Leave-time from work may vary depending on the type of surgery you have.  Appropriate arrangements should be made prior to surgery with your employer. 13. Prescriptions will be provided immediately following surgery by your doctor.  Fill these as soon as possible after surgery and take the medication as directed. Pain medications will not be refilled on weekends and must be approved by the doctor. 14. Remove nail polish on the operative foot and avoid getting pedicures prior to surgery. 15. Wash the night before surgery.  The night before surgery wash the foot and leg well with water and the antibacterial soap provided. Be sure to pay special attention to beneath the toenails and in between the toes.  Wash for at least three (3) minutes. Rinse thoroughly with water and dry well with a towel.  Perform this wash unless told not to do so by your physician.  Enclosed: 1 Ice pack (please put in freezer the night before surgery)   1 Hibiclens skin cleaner     Pre-op instructions  If you have any questions regarding the instructions, please do not hesitate to call our office.  [place]: [address], [place], [place] [address] -- [phone]  [place]: [address] [place], [place] [address] -- [phone]  [place]: [address]  [place], [place] [address] -- [phone]  [place]: [address], [address], [place], [place] [address] -- [phone]  Website:  [email or web address] 

## 2018-10-23 NOTE — Progress Notes (Signed)
Subjective:   Patient ID: Forrestine Him, female   DOB: 73 y.o.   MRN: [id number]   HPI Patient states her toe is killing her and she knows that she needs to get it fixed.  States that the training only helped for a couple weeks and it is come right back again and it is been sore and making it difficult for her to wear shoe gear comfortably   ROS      Objective:  Physical Exam  Neurovascular status intact with patient found to have significant keratotic lesion of the left second digit with the left big toe pressing against it with keratotic lesion also noted on the lateral side of the left hallux     Assessment:  Abnormal structure between the big toe second toe left with keratotic lesion formation left hallux second toe     Plan:  H&P x-ray reviewed condition discussed.  At this point I do think that correction would probably be best for her given the fact that it is so sore and I did check and found her to have excellent circulatory status with warm digits and good digital perfusion.  Patient has the hallux which is abutting against the second toe and at this point I reviewed her x-rays and discussed a Aiken osteotomy with digital correction second toe.  I allowed her to read consent form going over alternative treatments complications and she is completely accepting of this understanding risk and alternative treatments and wants surgery.  At this point she is scheduled for outpatient surgery and signed consent form and she is dispensed air fracture walker and understands total recovery can take 6 months to 1 year.  Patient is encouraged to call with any questions she may have prior to the procedure

## 2018-11-05 DIAGNOSIS — R251 Tremor, unspecified: Secondary | ICD-10-CM | POA: Insufficient documentation

## 2018-11-11 ENCOUNTER — Telehealth: Payer: Self-pay | Admitting: *Deleted

## 2018-11-11 NOTE — Telephone Encounter (Signed)
"  I'm calling to make sure you got my message about canceling my surgery for tomorrow."  Yes, I received your message.  "The person that was going to bring me was a bit eerie about bringing me."  I understand.  Dr. Paulla Dolly was rescheduling all his cases for tomorrow and moving them to April 7 anyway.  "Oh he was?  I wanted to have it done but I'll call back later to reschedule it."

## 2018-11-22 ENCOUNTER — Other Ambulatory Visit: Payer: Medicare Other

## 2018-12-30 ENCOUNTER — Telehealth: Payer: Self-pay | Admitting: *Deleted

## 2018-12-30 NOTE — Telephone Encounter (Signed)
"  I'm calling to see if you all are doing surgeries again."  Yes, we are doing surgeries again.  "I'd like to schedule my surgery that I canceled previously."  Dr. Paulla Dolly does surgeries on Mondays.  Do you have a date that you like?  "Yes, I'd like to do it on June 2."  I'll get it scheduled, that date is available.  You'll get a call from someone from the surgical center a day or two prior to your surgery date and that person will give you your arrival time."    I scheduled her surgery for January 21, 2019 via the surgical center's [facility].

## 2019-01-21 ENCOUNTER — Encounter: Payer: Self-pay | Admitting: Podiatry

## 2019-01-21 DIAGNOSIS — M2012 Hallux valgus (acquired), left foot: Secondary | ICD-10-CM | POA: Diagnosis not present

## 2019-01-21 DIAGNOSIS — M2042 Other hammer toe(s) (acquired), left foot: Secondary | ICD-10-CM | POA: Diagnosis not present

## 2019-01-23 ENCOUNTER — Telehealth: Payer: Self-pay | Admitting: *Deleted

## 2019-01-23 MED ORDER — ONDANSETRON HCL 4 MG PO TABS: 4.0000 mg | ORAL_TABLET | Freq: Three times a day (TID) | ORAL | 0 refills | Status: DC | PRN

## 2019-01-23 NOTE — Telephone Encounter (Signed)
Called patient at [phone] (cell #) to check to see how they were doing from when they had surgery with Dr.Regal on Tuesday, January 21, 2019.  DOS 01/21/19 Aiken Osteotomy LT; Hammer Toe Repair 2nd LT  Patient stated, "I am doing fine; I am in no pain; Pain 2/10; I am taking my pain medication as prescribed, but I am getting constipated. I am taking stool softeners but not sure if it is helping yet. I am wearing my boot and elevating my foot"  Patient has no fever, chills, nausea or vomiting; shortness of breath or tightness in calf.  Patient's bandage is clean, dry and intact.  There is no excessive tightness, bleeding or drainage coming from the bandage.  Patient stated, "I understood the post op instruction sheet that I was given and I have no questions or concerns at this time regarding my care".  Patient's next appointment is on:  Monday, June 8 at 11:30 am

## 2019-01-23 NOTE — Addendum Note (Signed)
Addended by: Celene Skeen A on: 01/23/2019 11:52 AM   Modules accepted: Orders

## 2019-01-27 ENCOUNTER — Other Ambulatory Visit: Payer: Self-pay

## 2019-01-27 ENCOUNTER — Ambulatory Visit (INDEPENDENT_AMBULATORY_CARE_PROVIDER_SITE_OTHER): Payer: Medicare Other

## 2019-01-27 ENCOUNTER — Ambulatory Visit (INDEPENDENT_AMBULATORY_CARE_PROVIDER_SITE_OTHER): Payer: Medicare Other | Admitting: Podiatry

## 2019-01-27 VITALS — Temp 97.7°F

## 2019-01-27 DIAGNOSIS — M2042 Other hammer toe(s) (acquired), left foot: Secondary | ICD-10-CM | POA: Diagnosis not present

## 2019-01-27 DIAGNOSIS — Z09 Encounter for follow-up examination after completed treatment for conditions other than malignant neoplasm: Secondary | ICD-10-CM

## 2019-01-27 NOTE — Progress Notes (Signed)
Subjective:   Patient ID: Natalie Page, female   DOB: 73 y.o.   MRN: [id number]   HPI Patient states her foot feels fine and has not had any pain medicine recently   ROS      Objective:  Physical Exam  Neurovascular status negative Homans sign noted with wound edges on the left hallux second toe in good alignment pin intact second toe with good structural position of both the hallux and second toe     Assessment:  Doing well post surgery left     Plan:  H&P discussed condition recommended the continuation of immobilization elevation compression and reapplied sterile dressing and reappoint for stitch removal in 10 days or earlier if needed  X-rays indicate everything is healing well with slight stress on the osteotomy of the left hallux and I did discuss not using surgical shoe currently and continuing elevation and that she had soft bone but it should hopefully heal uneventfully but needs to be watched

## 2019-02-10 ENCOUNTER — Other Ambulatory Visit: Payer: Self-pay

## 2019-02-10 ENCOUNTER — Ambulatory Visit (INDEPENDENT_AMBULATORY_CARE_PROVIDER_SITE_OTHER): Payer: Self-pay | Admitting: Podiatry

## 2019-02-10 ENCOUNTER — Ambulatory Visit (INDEPENDENT_AMBULATORY_CARE_PROVIDER_SITE_OTHER): Payer: Medicare Other

## 2019-02-10 ENCOUNTER — Encounter: Payer: Self-pay | Admitting: Podiatry

## 2019-02-10 DIAGNOSIS — M2042 Other hammer toe(s) (acquired), left foot: Secondary | ICD-10-CM

## 2019-02-10 DIAGNOSIS — Z09 Encounter for follow-up examination after completed treatment for conditions other than malignant neoplasm: Secondary | ICD-10-CM

## 2019-02-11 NOTE — Progress Notes (Signed)
Subjective:   Patient ID: Natalie Page, female   DOB: 72 y.o.   MRN: [id number]   HPI Patient states I am feeling some better but still having to be careful and keep it elevated and still immobilized   ROS      Objective:  Physical Exam  Neuro vascular status intact with patient's left foot healing well wound edges well coapted and second digit in line with pin in place     Assessment:  Overall doing well with some stress on the big toe secondary to soft bone but overall clinically things look good she is very pleased     Plan:  8 H&P x-ray reviewed and I evaluated this patient and explained continued immobilization and pin removal 2 weeks and sterile dressing reapplied with stitches removed today with wound edges well coapted  X-ray indicates that the second digit and the metatarsal hallux are in good alignment currently

## 2019-02-24 ENCOUNTER — Ambulatory Visit (INDEPENDENT_AMBULATORY_CARE_PROVIDER_SITE_OTHER): Payer: Medicare Other | Admitting: Podiatry

## 2019-02-24 ENCOUNTER — Ambulatory Visit (INDEPENDENT_AMBULATORY_CARE_PROVIDER_SITE_OTHER): Payer: Medicare Other

## 2019-02-24 ENCOUNTER — Other Ambulatory Visit: Payer: Self-pay

## 2019-02-24 DIAGNOSIS — Z09 Encounter for follow-up examination after completed treatment for conditions other than malignant neoplasm: Secondary | ICD-10-CM

## 2019-02-24 DIAGNOSIS — M2042 Other hammer toe(s) (acquired), left foot: Secondary | ICD-10-CM

## 2019-02-24 NOTE — Progress Notes (Signed)
Subjective:   Patient ID: Natalie Page, female   DOB: 73 y.o.   MRN: [id number]   HPI Patient states doing well with left foot ready to get pin out and I am very pleased so far with how I do not with surgery and ready to put a shoe   ROS      Objective:  Physical Exam  Neurovascular status intact negative Homans sign noted pin intact second digit with good alignment hallux second toe with mild swelling consistent with.  Postop     Assessment:  Doing well post foot surgery left     Plan:  H&P x-ray discussed and remove pins second digit applying sterile dressing.  Dispensed compression stocking given instructions on gradual return to soft shoe gear but continue surgical shoe at the time and range of motion exercises  X-rays indicate that the structural bone is healing well and clinically looks excellent signed visit

## 2019-03-06 ENCOUNTER — Telehealth: Payer: Self-pay | Admitting: *Deleted

## 2019-03-06 ENCOUNTER — Encounter: Payer: Self-pay | Admitting: Podiatry

## 2019-03-06 NOTE — Telephone Encounter (Signed)
Returned patient's call at [phone] (cell #) to answer their question whether it is normal that their toe cannot bend that she had surgery on.  DOS 01/21/19 Aiken Osteotomy w/wire LT; Hammertoe Repair 2nd LT  I told patient that it is normal to have swelling up to 6 months from surgery in her toe. As the swelling decreases, her range of motion will get better.  Patient stated she understood.

## 2019-03-24 ENCOUNTER — Encounter: Payer: Self-pay | Admitting: Podiatry

## 2019-03-24 ENCOUNTER — Ambulatory Visit (INDEPENDENT_AMBULATORY_CARE_PROVIDER_SITE_OTHER): Payer: Medicare Other

## 2019-03-24 ENCOUNTER — Ambulatory Visit (INDEPENDENT_AMBULATORY_CARE_PROVIDER_SITE_OTHER): Payer: Self-pay | Admitting: Podiatry

## 2019-03-24 ENCOUNTER — Other Ambulatory Visit: Payer: Self-pay

## 2019-03-24 VITALS — Temp 97.2°F

## 2019-03-24 DIAGNOSIS — M2042 Other hammer toe(s) (acquired), left foot: Secondary | ICD-10-CM | POA: Diagnosis not present

## 2019-03-26 NOTE — Progress Notes (Signed)
Subjective:   Patient ID: Natalie Page, female   DOB: 73 y.o.   MRN: [id number]   HPI Patient states overall doing fine with mild swelling still noted the second toe and soreness if she does too much   ROS      Objective:  Physical Exam  Neurovascular status doing well negative Homans sign was noted with clinical straightening of the left hallux and second digit in excellent position with mild swelling still noted second toe with incision site healed well     Assessment:  Overall doing well clinically with still some swelling second toe     Plan:  Reviewed x-rays and discussed that there is still healing to go in that we may end up healing with a fibrous type union but the toe has no movement clinically and I do think that eventually the swelling will refuse and it is normal to still have swelling at this point  X-rays indicate adequate alignment with fixation in place currently with patient having relatively soft bone and may have some distal displacement of the hallux but clinically it is functioning well

## 2019-05-19 ENCOUNTER — Encounter: Payer: Self-pay | Admitting: Podiatry

## 2019-05-19 ENCOUNTER — Other Ambulatory Visit: Payer: Self-pay

## 2019-05-19 ENCOUNTER — Ambulatory Visit (INDEPENDENT_AMBULATORY_CARE_PROVIDER_SITE_OTHER): Payer: Medicare Other | Admitting: Podiatry

## 2019-05-19 ENCOUNTER — Ambulatory Visit (INDEPENDENT_AMBULATORY_CARE_PROVIDER_SITE_OTHER): Payer: Medicare Other

## 2019-05-19 DIAGNOSIS — M2042 Other hammer toe(s) (acquired), left foot: Secondary | ICD-10-CM

## 2019-05-19 NOTE — Progress Notes (Signed)
Subjective:   Patient ID: Natalie Page, female   DOB: 73 y.o.   MRN: [id number]   HPI Patient states she is improving and while it still feels like there is a full feeling she is no longer having problems and the corn has disappeared   ROS      Objective:  Physical Exam  Neurovascular status intact with patient's left hallux second digit in good alignment mild swelling but no other indication of pathology     Assessment:  Doing well post Akin osteotomy digital repair second toe with normal amount of swelling     Plan:  Reviewed x-ray and explained it can take upwards of 6 to 9 months to go away completely but as far as I can tell everything looks like it is doing well and the swelling should continue to recede  X-ray indicates everything is in good alignment fixation in place

## 2019-06-06 ENCOUNTER — Other Ambulatory Visit: Payer: Self-pay | Admitting: Specialist

## 2019-06-06 DIAGNOSIS — Z1231 Encounter for screening mammogram for malignant neoplasm of breast: Secondary | ICD-10-CM

## 2019-08-01 ENCOUNTER — Other Ambulatory Visit: Payer: Self-pay

## 2019-08-01 ENCOUNTER — Ambulatory Visit
Admission: RE | Admit: 2019-08-01 | Discharge: 2019-08-01 | Disposition: A | Discharge status: Home / Self Care | Payer: Medicare Other | Source: Ambulatory Visit | Attending: Specialist | Admitting: Specialist

## 2019-08-01 DIAGNOSIS — Z1231 Encounter for screening mammogram for malignant neoplasm of breast: Secondary | ICD-10-CM

## 2019-08-05 ENCOUNTER — Other Ambulatory Visit: Payer: Self-pay | Admitting: Specialist

## 2019-08-05 DIAGNOSIS — R928 Other abnormal and inconclusive findings on diagnostic imaging of breast: Secondary | ICD-10-CM

## 2019-08-08 ENCOUNTER — Ambulatory Visit
Admission: RE | Admit: 2019-08-08 | Discharge: 2019-08-08 | Disposition: A | Discharge status: Home / Self Care | Payer: Medicare Other | Source: Ambulatory Visit | Attending: Specialist | Admitting: Specialist

## 2019-08-08 ENCOUNTER — Other Ambulatory Visit: Payer: Self-pay

## 2019-08-08 DIAGNOSIS — R928 Other abnormal and inconclusive findings on diagnostic imaging of breast: Secondary | ICD-10-CM

## 2019-08-26 ENCOUNTER — Other Ambulatory Visit: Payer: Self-pay

## 2019-08-26 ENCOUNTER — Ambulatory Visit (INDEPENDENT_AMBULATORY_CARE_PROVIDER_SITE_OTHER): Payer: Medicare Other | Admitting: Internal Medicine

## 2019-08-26 ENCOUNTER — Encounter: Payer: Self-pay | Admitting: Internal Medicine

## 2019-08-26 VITALS — BP 164/78 | HR 72 | Temp 97.6°F | Ht 64.0 in | Wt 159.4 lb

## 2019-08-26 DIAGNOSIS — Z79899 Other long term (current) drug therapy: Secondary | ICD-10-CM

## 2019-08-26 DIAGNOSIS — R197 Diarrhea, unspecified: Secondary | ICD-10-CM

## 2019-08-26 DIAGNOSIS — K219 Gastro-esophageal reflux disease without esophagitis: Secondary | ICD-10-CM | POA: Diagnosis not present

## 2019-08-26 NOTE — Progress Notes (Signed)
Primary Care Physician:  Pomposini, Cherly Anderson, MD Primary Gastroenterologist:  Dr. Gala Romney  Pre-Procedure History & Physical: HPI:  Natalie Page is a 74 y.o. female here for further evaluation of nonbloody diarrhea of about 3 months duration.  Patient states she insidiously went from 1 formed bowel movement daily back in September 2021 to 6-7  Nonformed, nonbloody stools.  Occasional bouts of fecal incontinence.  No associated abdominal pain or fever. No improvement with Imodium and Pepto-Bismol. Negative colonoscopy -  done for high screening purposes back in 2019; no future colonoscopy recommended due to age.  Patient denies any prior history of bowel dysfunction or diarrhea.  Historically, 1 bowel movement daily. Chronic medications include omeprazole and oral magnesium.  She is not taking any nonsteroidal agents.  Takes Tylenol for arthritis. Of note, she was hospitalized about a year ago in [place] for urinary tract infection.  She has been on antibiotics since that time.  I do see with and she was given Cipro in May of last year. Appetite is good; she has not lost any weight. She had 2 episodes of fleeting chest pain lasting less than 15 minutes back in September for which she went through an extensive cardiac evaluation which was negative.  Typical reflux symptoms continue to be well controlled on omeprazole 20 mg daily.  No dysphagia.   Past Medical History:  Diagnosis Date  . Adenomatous polyp of colon   . Cancer ([place])    on back- found to have basal ca  . Cervical disc disorder   . Diverticular disease   . Endometriosis   . GERD (gastroesophageal reflux disease)   . H/O cardiovascular stress test 20 yrs. ago- 1990's   told that it was normal  . Hypertension   . Multinodular goiter   . Osteoarthritis   . Osteopenia   . Partial deafness   . Peripheral neuropathy   . TMJ (dislocation of temporomandibular joint)   . Varicose vein of leg    bilateral    Past  Surgical History:  Procedure Laterality Date  . CATARACT EXTRACTION Bilateral   . COLONOSCOPY  2013   Dr. Algis Greenhouse: small cecal polyp and small transverse colon polyp, left colon diverticulosis, tubular adenoma  . COLONOSCOPY WITH PROPOFOL N/A 11/01/2017   Procedure: COLONOSCOPY WITH PROPOFOL;  Surgeon: Daneil Dolin, MD;  [facility];  Service: Endoscopy;  Laterality: N/A;  12:30pm  . ESOPHAGOGASTRODUODENOSCOPY  03/2012   Dr. Algis Greenhouse: large hiatal hernia, otherwise normal  . EYE SURGERY Right    eyelid  . HAND SURGERY Bilateral 2001  . KNEE ARTHROPLASTY Left 12/18/2016   Procedure: LEFT TOTAL KNEE ARTHROPLASTY WITH COMPUTER NAVIGATION;  Surgeon: Rod Can, MD;  Location: [facility];  Service: Orthopedics;  Laterality: Left;  Dr. request RNFA  . TONSILLECTOMY    . TONSILLECTOMY AND ADENOIDECTOMY      Prior to Admission medications   Medication Sig Start Date End Date Taking? Authorizing Provider  acetaminophen (TYLENOL) 325 MG tablet Take 500 mg by mouth at bedtime.    Yes [provider]  alendronate (FOSAMAX) 70 MG tablet alendronate 70 mg tablet   Yes [provider]  aspirin 81 MG chewable tablet Chew 1 tablet (81 mg total) by mouth 2 (two) times daily. Patient taking differently: Chew 81 mg by mouth daily.  12/19/16  Yes Swinteck, Aaron Edelman, MD  atorvastatin (LIPITOR) 10 MG tablet atorvastatin 10 mg tablet   Yes [provider]  BIOTIN 5000 PO  Take 1 tablet by mouth 2 (two) times daily.    Yes [provider]  calcium-vitamin D (OSCAL WITH D) 500-200 MG-UNIT tablet Take 1 tablet by mouth 2 (two) times daily. Oysco Calcium 500 with Vitamin D   Yes [provider]  Cyanocobalamin (VITAMIN B-12) 5000 MCG TBDP Take 5,000 mcg by mouth daily.   Yes [provider]  diclofenac sodium (VOLTAREN) 1 % GEL Apply 1 application topically 2 (two) times daily. 2-3 times a day to feet    Yes [provider]  folic acid (FOLVITE)  0.5 MG tablet folic acid  0000000 q day   Yes [provider]  furosemide (LASIX) 20 MG tablet Take 20 mg by mouth daily.  06/03/16  Yes [provider]  gabapentin (NEURONTIN) 800 MG tablet Take 1 tablet (800 mg total) by mouth 3 (three) times daily. Patient taking differently: Take 800 mg by mouth 2 (two) times daily.  10/06/16  Yes Marcial Pacas, MD  hydrochlorothiazide (HYDRODIURIL) 25 MG tablet Take 25 mg by mouth daily.  06/03/16  Yes [provider]  hydroxychloroquine (PLAQUENIL) 200 MG tablet Take 200 mg by mouth 2 (two) times daily.   Yes [provider]  losartan (COZAAR) 50 MG tablet Take 50 mg by mouth daily.  11/18/15  Yes [provider]  magnesium oxide (MAG-OX) 400 MG tablet Take 500 mg by mouth every evening.    Yes [provider]  meloxicam (MOBIC) 15 MG tablet Take 15 mg by mouth daily.   Yes [provider]  omeprazole (PRILOSEC) 20 MG capsule Take 20 mg by mouth daily.  03/16/16  Yes [provider]  predniSONE (DELTASONE) 5 MG tablet Take 5 mg by mouth daily with breakfast.  07/22/18  Yes [provider]  sertraline (ZOLOFT) 100 MG tablet Take 100 mg by mouth daily.   Yes [provider]    Allergies as of 08/26/2019 - Review Complete 08/26/2019  Allergen Reaction Noted  . No known allergies  12/15/2016    Family History  Problem Relation Age of Onset  . Stroke Mother   . COPD Father   . Cancer Father        Unsure of type - "on his chin"  . Throat cancer Sister   . Stomach cancer Brother   . Breast cancer Sister   . Colon cancer Neg Hx     Social History   Socioeconomic History  . Marital status: Married    Spouse name: Not on file  . Number of children: 0  . Years of education: 56  . Highest education level: Not on file  Occupational History  . Occupation: Retired  Tobacco Use  . Smoking status: Never Smoker  . Smokeless tobacco: Never Used  Substance and Sexual  Activity  . Alcohol use: Yes    Comment: 2 glasses three times weekly.  . Drug use: No  . Sexual activity: Yes    Birth control/protection: Post-menopausal  Other Topics Concern  . Not on file  Social History Narrative   Lives at home with husband.   Left-handed.   4-5 cups caffeine daily.   Social Determinants of Health   Financial Resource Strain:   . Difficulty of Paying Living Expenses: Not on file  Food Insecurity:   . Worried About Charity fundraiser in the Last Year: Not on file  . Ran Out of Food in the Last Year: Not on file  Transportation Needs:   .  Lack of Transportation (Medical): Not on file  . Lack of Transportation (Non-Medical): Not on file  Physical Activity:   . Days of Exercise per Week: Not on file  . Minutes of Exercise per Session: Not on file  Stress:   . Feeling of Stress : Not on file  Social Connections:   . Frequency of Communication with Friends and Family: Not on file  . Frequency of Social Gatherings with Friends and Family: Not on file  . Attends Religious Services: Not on file  . Active Member of Clubs or Organizations: Not on file  . Attends Archivist Meetings: Not on file  . Marital Status: Not on file  Intimate Partner Violence:   . Fear of Current or Ex-Partner: Not on file  . Emotionally Abused: Not on file  . Physically Abused: Not on file  . Sexually Abused: Not on file    Review of Systems: See HPI, otherwise negative ROS  Physical Exam: BP (!) 164/78   Pulse 72   Temp 97.6 F (36.4 C) (Oral)   Ht 5\' 4"  (1.626 m)   Wt 159 lb 6.4 oz (72.3 kg)   BMI 27.36 kg/m  General:   Alert,   pleasant and cooperative in NAD Neck:  Supple; no masses or thyromegaly. No significant cervical adenopathy. Lungs:  Clear throughout to auscultation.   No wheezes, crackles, or rhonchi. No acute distress. Heart:  Regular rate and rhythm; no murmurs, clicks, rubs,  or gallops. Abdomen: Non-distended, normal bowel sounds.  Soft and  nontender without appreciable mass or hepatosplenomegaly.  Pulses:  Normal pulses noted. Extremities:  Without clubbing or edema.  Impression: 74 year old lady with nonbloody diarrhea of 3 months duration.  Has been exposed to antibiotics over the past 12 months. Indolent C. difficile infection needs to be ruled out.  New onset persistent, nonbloody diarrhea in an elderly female foot microscopic colitis fairly high on the differential list at this time as well. Oral magnesium supplementation may be associated with her symptoms but less likely since she has been on this regimen for some years.  Omeprazole, in a small minority of patients has been associated with diarrhea.  Chest pain back in September likely a totally separate issue.  It appears to be chest pain of uncertain etiology.  Cardiac work-up negative.  May or may not be related to reflux.  Certainly no symptoms in the past 3 months.  Typical GERD symptoms well controlled.  Recommendations:  Stool for C difficile assay  Trial of Activia - one serving daily  No change in medications for now but plans could change  May need colonic biopsies to further evaluate as to the cause of diarrhea   Further recommendations to follow     Notice: This dictation was prepared with Dragon dictation along with smaller phrase technology. Any transcriptional errors that result from this process are unintentional and may not be corrected upon review.

## 2019-08-26 NOTE — Patient Instructions (Addendum)
Stool for C difficile assay  Trial of Activia - one serving daily  No change in medications for now but plans could change  May need biopsy of your colon lining to further evaluate as to the cause of diarrhea   Further recommendations to follow

## 2019-08-30 LAB — CLOSTRIDIUM DIFFICILE EIA: C difficile Toxins A+B, EIA: NEGATIVE

## 2019-10-14 ENCOUNTER — Telehealth: Payer: Self-pay | Admitting: Internal Medicine

## 2019-10-14 NOTE — Telephone Encounter (Signed)
Waiting on a return call.

## 2019-10-14 NOTE — Telephone Encounter (Signed)
Pt called with a progress report. [t was having diarrhea and it has changed to constipation. Pt is eating Activa daily  as directed, pt is having BM every 2 days, pt is taking Colace 2 tabs daily 100 mg. Pt isn't taking any other medications to make her have a BM. Pt isn't having to strain and feels she eats enough food daily to have a BM daily.  Pt reports no abdominal pain, no nausea and no bleeding.

## 2019-10-14 NOTE — Telephone Encounter (Signed)
Pt saw RMR on 1/5 and was calling to give an update. She said she went from having diarrhea and is now having problems with constipation. Please advise. [phone]

## 2019-10-15 NOTE — Telephone Encounter (Signed)
Glad diarrhea has resolved.  C. difficile negative.  Continue to monitor stools.  Would like to see 3 bowel movements weekly; no further intervention warranted at this time.  Keep follow-up appointment.

## 2019-10-15 NOTE — Telephone Encounter (Signed)
Spoke with pt. Pt was notified of RMR's recommendations.

## 2019-12-16 ENCOUNTER — Telehealth: Payer: Self-pay | Admitting: Neurology

## 2019-12-16 NOTE — Telephone Encounter (Signed)
Pt is a returning pt of Dr. Greer Pickerel. She is being referred back to our office for tremors. She is requesting to switch her care from Dr. Krista Blue to Dr. Jannifer Franklin.  Please advise if ok to Switch?  Thank you

## 2019-12-16 NOTE — Telephone Encounter (Signed)
Ok to switch 

## 2019-12-16 NOTE — Telephone Encounter (Signed)
OK with me.

## 2020-01-26 ENCOUNTER — Institutional Professional Consult (permissible substitution): Payer: Medicare Other | Admitting: Neurology

## 2020-02-10 ENCOUNTER — Encounter: Payer: Self-pay | Admitting: Neurology

## 2020-02-10 ENCOUNTER — Ambulatory Visit (INDEPENDENT_AMBULATORY_CARE_PROVIDER_SITE_OTHER): Payer: Medicare Other | Admitting: Neurology

## 2020-02-10 ENCOUNTER — Other Ambulatory Visit: Payer: Self-pay

## 2020-02-10 DIAGNOSIS — G250 Essential tremor: Secondary | ICD-10-CM

## 2020-02-10 HISTORY — DX: Essential tremor: G25.0

## 2020-02-10 NOTE — Progress Notes (Signed)
Reason for visit: Tremor  Referring physician: Dr. [name] Natalie Natalie Page is a 74 y.o. female  History of present illness:  Natalie Natalie Page is a 74 year old left-handed white female with a history of neuropathy type symptoms, seen through this office previously.  The patient over the last 18 months has noted a slight tremor of the left upper extremity that is most significant in the morning, it seems to be better later on in the day.  She notes that the tremor wall the most notable when she is trying to feed herself, she has no resting component.  She denies any changes in her handwriting.  She has not had any noticeable tremor on the right hand.  She has been followed through neurology in the [place], [place] area and was told she had an essential tremor.  The patient denies any tremor involving the voice or head or neck.  She has no family history of tremor.  She denies any changes in balance, she has not had any falls.  She may occasionally have some dizziness when she stands up rapidly.  She has no tremor involving the foot.  She does have some numbness on the top of the right foot that is chronic.  She is sent to this office for an evaluation.  She is on no medications for the tremor.  Past Medical History:  Diagnosis Date  . Adenomatous polyp of colon   . Cancer ([place])    on back- found to have basal ca  . Cervical disc disorder   . Diverticular disease   . Endometriosis   . GERD (gastroesophageal reflux disease)   . H/O cardiovascular stress test 20 yrs. ago- 1990's   told that it was normal  . Hypertension   . Multinodular goiter   . Osteoarthritis   . Osteopenia   . Partial deafness   . Peripheral neuropathy   . TMJ (dislocation of temporomandibular joint)   . Varicose vein of leg    bilateral    Past Surgical History:  Procedure Laterality Date  . CATARACT EXTRACTION Bilateral   . COLONOSCOPY  2013   Dr. Algis Greenhouse: small cecal polyp and small transverse colon polyp,  left colon diverticulosis, tubular adenoma  . COLONOSCOPY WITH PROPOFOL N/A 11/01/2017   Procedure: COLONOSCOPY WITH PROPOFOL;  Surgeon: Daneil Dolin, MD;  [facility];  Service: Endoscopy;  Laterality: N/A;  12:30pm  . ESOPHAGOGASTRODUODENOSCOPY  03/2012   Dr. Algis Greenhouse: large hiatal hernia, otherwise normal  . EYE SURGERY Right    eyelid  . HAND SURGERY Bilateral 2001  . KNEE ARTHROPLASTY Left 12/18/2016   Procedure: LEFT TOTAL KNEE ARTHROPLASTY WITH COMPUTER NAVIGATION;  Surgeon: Rod Can, MD;  Location: [facility];  Service: Orthopedics;  Laterality: Left;  Dr. request RNFA  . TONSILLECTOMY    . TONSILLECTOMY AND ADENOIDECTOMY      Family History  Problem Relation Age of Onset  . Stroke Mother   . COPD Father   . Cancer Father        Unsure of type - "on his chin"  . Throat cancer Sister   . Stomach cancer Brother   . Breast cancer Sister   . Colon cancer Neg Hx     Social history:  reports that she has never smoked. She has never used smokeless tobacco. She reports current alcohol use. She reports that she does not use drugs.  Medications:  Prior to Admission medications   Medication Sig Start Date End  Date Taking? Authorizing Provider  acetaminophen (TYLENOL) 325 MG tablet Take 500 mg by mouth at bedtime.    Yes [provider]  alendronate (FOSAMAX) 70 MG tablet alendronate 70 mg tablet   Yes [provider]  aspirin 81 MG chewable tablet Chew 1 tablet (81 mg total) by mouth 2 (two) times daily. Patient taking differently: Chew 81 mg by mouth daily.  12/19/16  Yes Swinteck, Aaron Edelman, MD  atorvastatin (LIPITOR) 10 MG tablet atorvastatin 10 mg tablet   Yes [provider]  B Complex Vitamins (B COMPLEX PO) Take by mouth.   Yes [provider]  calcium-vitamin D (OSCAL WITH D) 500-200 MG-UNIT tablet Take 1 tablet by mouth 2 (two) times daily. Oysco Calcium 500 with Vitamin D   Yes [provider]  diclofenac sodium  (VOLTAREN) 1 % GEL Apply 1 application topically 2 (two) times daily. 2-3 times a day to feet    Yes [provider]  folic acid (FOLVITE) 0.5 MG tablet folic acid  161WRU q day   Yes [provider]  furosemide (LASIX) 20 MG tablet Take 20 mg by mouth daily.  06/03/16  Yes [provider]  gabapentin (NEURONTIN) 800 MG tablet Take 1 tablet (800 mg total) by mouth 3 (three) times daily. Patient taking differently: Take 800 mg by mouth 2 (two) times daily.  10/06/16  Yes Marcial Pacas, MD  hydrochlorothiazide (HYDRODIURIL) 25 MG tablet Take 25 mg by mouth daily.  06/03/16  Yes [provider]  hydroxychloroquine (PLAQUENIL) 200 MG tablet Take 200 mg by mouth 2 (two) times daily.   Yes [provider]  losartan (COZAAR) 50 MG tablet Take 50 mg by mouth daily.  11/18/15  Yes [provider]  magnesium oxide (MAG-OX) 400 MG tablet Take 500 mg by mouth every evening.    Yes [provider]  meloxicam (MOBIC) 15 MG tablet Take 15 mg by mouth daily.   Yes [provider]  Multiple Vitamins-Minerals (PRESERVISION AREDS 2 PO) Take by mouth.   Yes [provider]  omeprazole (PRILOSEC) 20 MG capsule Take 20 mg by mouth daily.  03/16/16  Yes [provider]  predniSONE (DELTASONE) 5 MG tablet Take 5 mg by mouth daily with breakfast.  07/22/18  Yes [provider]  sertraline (ZOLOFT) 100 MG tablet Take 100 mg by mouth daily.   Yes [provider]      Allergies  Allergen Reactions  . No Known Allergies     ROS:  Out of a complete 14 system review of symptoms, the patient complains only of the following symptoms, and all other reviewed systems are negative.  Tremor Right foot numbness  Blood pressure (!) 142/65, pulse 80, weight 157 lb (71.2 kg).  Physical Exam  General: The patient is alert and cooperative at the time of the examination.  Eyes: Pupils are equal, round, and reactive to light.  Discs are flat bilaterally.  Neck: The neck is supple, no carotid bruits are noted.  Respiratory: The respiratory examination is clear.  Cardiovascular: The cardiovascular examination reveals a regular rate and rhythm, no obvious murmurs or rubs are noted.  Skin: Extremities are without significant edema.  Neurologic Exam  Mental status: The patient is alert and oriented x 3 at the time of the examination. The patient has apparent normal recent and remote memory, with an apparently normal attention span and concentration ability.  Cranial nerves: Facial symmetry is present. There is good sensation of the face  to pinprick and soft touch bilaterally. The strength of the facial muscles and the muscles to head turning and shoulder shrug are normal bilaterally. Speech is well enunciated, no aphasia or dysarthria is noted. Extraocular movements are full. Visual fields are full. The tongue is midline, and the patient has symmetric elevation of the soft palate. No obvious hearing deficits are noted.  Motor: The motor testing reveals 5 over 5 strength of all 4 extremities. Good symmetric motor tone is noted throughout.  Sensory: Sensory testing is intact to pinprick, soft touch, vibration sensation, and position sense on all 4 extremities. No evidence of extinction is noted.  Coordination: Cerebellar testing reveals good finger-nose-finger and heel-to-shin bilaterally.  When drawing a spiral, no tremor is translated into the handwriting.  Gait and station: Gait is normal. Tandem gait is slightly unsteady. Romberg is negative. No drift is seen.  Reflexes: Deep tendon reflexes are symmetric and normal bilaterally. Toes are downgoing bilaterally.   Assessment/Plan:  1.  Mild essential tremor  The patient has minimal symptoms at this time, no tremor was seen on clinical examination today.  At this time, I would not recommend any medical therapy, we will have the patient contact our office if the  tremor worsens over time and she requires treatment.  Jill Alexanders MD 02/10/2020 10:57 AM  [facility] [address] [place], [place] [address]  Phone [phone] Fax [phone]

## 2020-04-30 ENCOUNTER — Encounter: Payer: Self-pay | Admitting: Podiatry

## 2020-05-03 NOTE — Telephone Encounter (Signed)
Will need  to come in for evaluation

## 2020-05-04 ENCOUNTER — Encounter: Payer: Self-pay | Admitting: Podiatry

## 2020-05-14 ENCOUNTER — Ambulatory Visit: Payer: Medicare Other | Admitting: Podiatry

## 2020-05-19 ENCOUNTER — Other Ambulatory Visit: Payer: Self-pay

## 2020-05-19 ENCOUNTER — Ambulatory Visit (INDEPENDENT_AMBULATORY_CARE_PROVIDER_SITE_OTHER): Payer: Medicare Other

## 2020-05-19 ENCOUNTER — Ambulatory Visit (INDEPENDENT_AMBULATORY_CARE_PROVIDER_SITE_OTHER): Payer: Medicare Other | Admitting: Podiatry

## 2020-05-19 ENCOUNTER — Encounter: Payer: Self-pay | Admitting: Podiatry

## 2020-05-19 DIAGNOSIS — M7752 Other enthesopathy of left foot: Secondary | ICD-10-CM

## 2020-05-19 DIAGNOSIS — M2042 Other hammer toe(s) (acquired), left foot: Secondary | ICD-10-CM

## 2020-05-19 NOTE — Progress Notes (Signed)
Subjective:   Patient ID: Natalie Page, female   DOB: 74 y.o.   MRN: [id number]   HPI Patient states for the last 2 months she has been having pain in the big toe left and she is not sure of what may have happened and the corn that we worked on has gone away and she is very pleased   ROS      Objective:  Physical Exam  Neurovascular status intact with patient having healed well from [facility] osteotomy and arthroplasty digit to left foot with mild elevation of the distal aspect of the left hallux with irritation of tissue and possible irritation of the nailbed     Assessment:  Abnormal position of the inner phalangeal joint of the left big toe with irritation of dorsal tissue     Plan:  H&P x-ray reviewed and at this point debrided nail bed advised on soaks and apply cushioning to the area to try to take pressure off.  May ultimately require hallux fusion  Patient overall is doing well with the healing of the bone that we had done but there is quite a bit of deformity at the inner phalangeal joint left big toe with dorsal movement of the distal phalanx

## 2020-07-09 ENCOUNTER — Other Ambulatory Visit: Payer: Self-pay | Admitting: Specialist

## 2020-07-09 DIAGNOSIS — Z1231 Encounter for screening mammogram for malignant neoplasm of breast: Secondary | ICD-10-CM

## 2020-08-19 ENCOUNTER — Other Ambulatory Visit: Payer: Self-pay | Admitting: Nurse Practitioner

## 2020-08-19 DIAGNOSIS — N6320 Unspecified lump in the left breast, unspecified quadrant: Secondary | ICD-10-CM

## 2020-08-25 ENCOUNTER — Ambulatory Visit: Payer: Medicare Other

## 2020-09-28 ENCOUNTER — Ambulatory Visit
Admission: RE | Admit: 2020-09-28 | Discharge: 2020-09-28 | Disposition: A | Discharge status: Home / Self Care | Payer: Medicare Other | Source: Ambulatory Visit | Attending: Nurse Practitioner | Admitting: Nurse Practitioner

## 2020-09-28 ENCOUNTER — Other Ambulatory Visit: Payer: Self-pay

## 2020-09-28 DIAGNOSIS — N6320 Unspecified lump in the left breast, unspecified quadrant: Secondary | ICD-10-CM

## 2020-10-06 ENCOUNTER — Other Ambulatory Visit: Payer: Self-pay | Admitting: Nurse Practitioner

## 2020-10-06 DIAGNOSIS — N6320 Unspecified lump in the left breast, unspecified quadrant: Secondary | ICD-10-CM

## 2020-10-11 ENCOUNTER — Ambulatory Visit: Payer: Medicare Other

## 2020-10-11 ENCOUNTER — Ambulatory Visit
Admission: RE | Admit: 2020-10-11 | Discharge: 2020-10-11 | Disposition: A | Discharge status: Home / Self Care | Payer: Medicare Other | Source: Ambulatory Visit | Attending: Nurse Practitioner | Admitting: Nurse Practitioner

## 2020-10-11 ENCOUNTER — Other Ambulatory Visit: Payer: Self-pay

## 2020-10-11 DIAGNOSIS — N6320 Unspecified lump in the left breast, unspecified quadrant: Secondary | ICD-10-CM

## 2020-10-25 ENCOUNTER — Ambulatory Visit: Payer: Self-pay | Admitting: Student

## 2020-11-04 NOTE — Progress Notes (Signed)
DUE TO COVID-19 ONLY ONE VISITOR IS ALLOWED TO COME WITH YOU AND STAY IN THE WAITING ROOM ONLY DURING PRE OP AND PROCEDURE DAY OF SURGERY. THE 1 VISITOR  MAY VISIT WITH YOU AFTER SURGERY IN YOUR PRIVATE ROOM DURING VISITING HOURS ONLY!  YOU NEED TO HAVE A COVID 19 TEST ON3/28/2022 _______ @_______ , THIS TEST MUST BE DONE BEFORE SURGERY,  COVID TESTING SITE [address] [place] [address], IT IS ON THE RIGHT GOING OUT [address] APPROXIMATELY  2 MINUTES PAST [employer] ON THE RIGHT. ONCE YOUR COVID TEST IS COMPLETED,  PLEASE BEGIN THE QUARANTINE INSTRUCTIONS AS OUTLINED IN YOUR HANDOUT.                Natalie Page  11/04/2020   Your procedure is scheduled on:  11/17/2020   Report to [facility] Main  Entrance   Report to admitting at    0530 AM     Call this number if you have problems the morning of surgery [phone]    REMEMBER: NO  SOLID FOOD CANDY OR GUM AFTER MIDNIGHT. CLEAR LIQUIDS UNTIL    0430am      . NOTHING BY MOUTH EXCEPT CLEAR LIQUIDS UNTIL    0430am    . PLEASE FINISH ENSURE DRINK PER SURGEON ORDER  WHICH NEEDS TO BE COMPLETED AT   0430am    .      CLEAR LIQUID DIET   Foods Allowed                                                                    Coffee and tea, regular and decaf                            Fruit ices (not with fruit pulp)                                      Iced Popsicles                                    Carbonated beverages, regular and diet                                    Cranberry, grape and apple juices Sports drinks like Gatorade Lightly seasoned clear broth or consume(fat free) Sugar, honey syrup ___________________________________________________________________      BRUSH YOUR TEETH MORNING OF SURGERY AND RINSE YOUR MOUTH OUT, NO CHEWING GUM CANDY OR MINTS.     Take these medicines the morning of surgery with A SIP OF WATER: Amlodipine, Prilosec, Zoloft   DO NOT TAKE ANY DIABETIC  MEDICATIONS DAY OF YOUR SURGERY                               You may not have any metal on your body including hair pins and  piercings  Do not wear jewelry, make-up, lotions, powders or perfumes, deodorant             Do not wear nail polish on your fingernails.  Do not shave  48 hours prior to surgery.              Men may shave face and neck.   Do not bring valuables to the hospital. [place].  Contacts, dentures or bridgework may not be worn into surgery.  Leave suitcase in the car. After surgery it may be brought to your room.     Patients discharged the day of surgery will not be allowed to drive home. IF YOU ARE HAVING SURGERY AND GOING HOME THE SAME DAY, YOU MUST HAVE AN ADULT TO DRIVE YOU HOME AND BE WITH YOU FOR 24 HOURS. YOU MAY GO HOME BY TAXI OR UBER OR ORTHERWISE, BUT AN ADULT MUST ACCOMPANY YOU HOME AND STAY WITH YOU FOR 24 HOURS.  Name and phone number of your driver:  Special Instructions: N/A              Please read over the following fact sheets you were given: _____________________________________________________________________  [facility] - Preparing for Surgery Before surgery, you can play an important role.  Because skin is not sterile, your skin needs to be as free of germs as possible.  You can reduce the number of germs on your skin by washing with CHG (chlorahexidine gluconate) soap before surgery.  CHG is an antiseptic cleaner which kills germs and bonds with the skin to continue killing germs even after washing. Please DO NOT use if you have an allergy to CHG or antibacterial soaps.  If your skin becomes reddened/irritated stop using the CHG and inform your nurse when you arrive at Short Stay. Do not shave (including legs and underarms) for at least 48 hours prior to the first CHG shower.  You may shave your face/neck. Please follow these instructions carefully:  1.  Shower with CHG Soap the night  before surgery and the  morning of Surgery.  2.  If you choose to wash your hair, wash your hair first as usual with your  normal  shampoo.  3.  After you shampoo, rinse your hair and body thoroughly to remove the  shampoo.                           4.  Use CHG as you would any other liquid soap.  You can apply chg directly  to the skin and wash                       Gently with a scrungie or clean washcloth.  5.  Apply the CHG Soap to your body ONLY FROM THE NECK DOWN.   Do not use on face/ open                           Wound or open sores. Avoid contact with eyes, ears mouth and genitals (private parts).                       Wash face,  Genitals (private parts) with your normal soap.             6.  Wash thoroughly, paying special attention to the area where your surgery  will be performed.  7.  Thoroughly rinse your body with warm water from the neck down.  8.  DO NOT shower/wash with your normal soap after using and rinsing off  the CHG Soap.                9.  Pat yourself dry with a clean towel.            10.  Wear clean pajamas.            11.  Place clean sheets on your bed the night of your first shower and do not  sleep with pets. Day of Surgery : Do not apply any lotions/deodorants the morning of surgery.  Please wear clean clothes to the hospital/surgery center.  FAILURE TO FOLLOW THESE INSTRUCTIONS MAY RESULT IN THE CANCELLATION OF YOUR SURGERY PATIENT SIGNATURE_________________________________  NURSE SIGNATURE__________________________________  ________________________________________________________________________

## 2020-11-08 ENCOUNTER — Encounter (HOSPITAL_COMMUNITY)
Admission: RE | Admit: 2020-11-08 | Discharge: 2020-11-08 | Disposition: A | Discharge status: Home / Self Care | Payer: Medicare Other | Source: Ambulatory Visit | Attending: Orthopedic Surgery | Admitting: Orthopedic Surgery

## 2020-11-08 ENCOUNTER — Encounter (HOSPITAL_COMMUNITY): Payer: Self-pay

## 2020-11-17 ENCOUNTER — Encounter (HOSPITAL_COMMUNITY): Admission: RE | Payer: Self-pay | Source: Home / Self Care

## 2020-11-17 ENCOUNTER — Ambulatory Visit (HOSPITAL_COMMUNITY): Admission: RE | Admit: 2020-11-17 | Payer: Medicare Other | Source: Home / Self Care | Admitting: Orthopedic Surgery

## 2020-11-17 SURGERY — ARTHROPLASTY, KNEE, TOTAL, USING IMAGELESS COMPUTER-ASSISTED NAVIGATION
Anesthesia: Spinal | Site: Knee | Laterality: Right

## 2020-12-28 ENCOUNTER — Ambulatory Visit: Payer: Self-pay | Admitting: Student

## 2021-01-07 NOTE — Patient Instructions (Addendum)
DUE TO COVID-19 ONLY ONE VISITOR IS ALLOWED TO COME WITH YOU AND STAY IN THE WAITING ROOM ONLY DURING PRE OP AND PROCEDURE DAY OF SURGERY. THE 2 VISITORS  MAY VISIT WITH YOU AFTER SURGERY IN YOUR PRIVATE ROOM DURING VISITING HOURS ONLY!  YOU NEED TO HAVE A COVID 19 TEST ON_5/31______ @1 :00 PM_______, THIS TEST MUST BE DONE BEFORE SURGERY,  COVID TESTING SITE [place] [address], IT IS ON THE RIGHT GOING OUT [address] APPROXIMATELY  2 MINUTES PAST [employer] ON THE RIGHT. ONCE YOUR COVID TEST IS COMPLETED,  PLEASE BEGIN THE QUARANTINE INSTRUCTIONS AS OUTLINED IN YOUR HANDOUT.                Natalie Page   Your procedure is scheduled on: 01/20/21   Report to [facility] Main  Entrance   Report to short stay at 5:15 AM     Call this number if you have problems the morning of surgery [phone]   . BRUSH YOUR TEETH MORNING OF SURGERY AND RINSE YOUR MOUTH OUT, NO CHEWING GUM CANDY OR MINTS.  No food after midnight.    You may have clear liquid until 4:30 AM.    At 4:00 AM drink pre surgery drink  . Nothing by mouth after 4:30 AM.    Take these medicines the morning of surgery with A SIP OF WATER: Gabapentin. Amlodipine, Zoloft, Prilosec.                                 You may not have any metal on your body including hair pins and              piercings  Do not wear jewelry, make-up, lotions, powders or perfumes, deodorant             Do not wear nail polish on your fingernails.  Do not shave  48 hours prior to surgery.  .   Do not bring valuables to the hospital. [place].  Contacts, dentures or bridgework may not be worn into surgery.      Patients discharged the day of surgery will not be allowed to drive home.  IF YOU ARE HAVING SURGERY AND GOING HOME THE SAME DAY, YOU MUST HAVE AN ADULT TO DRIVE YOU HOME AND BE WITH YOU FOR 24 HOURS. YOU MAY GO HOME BY TAXI OR UBER OR  ORTHERWISE, BUT AN ADULT MUST ACCOMPANY YOU HOME AND STAY WITH YOU FOR 24 HOURS.  Name and phone number of your driver:  Special Instructions: N/A              Please read over the following fact sheets you were given: _____________________________________________________________________             [facility] - Preparing for Surgery Before surgery, you can play an important role.  Because skin is not sterile, your skin needs to be as free of germs as possible.  You can reduce the number of germs on your skin by washing with CHG (chlorahexidine gluconate) soap before surgery.  CHG is an antiseptic cleaner which kills germs and bonds with the skin to continue killing germs even after washing. Please DO NOT use if you have an allergy to CHG or antibacterial soaps.  If your skin becomes reddened/irritated  stop using the CHG and inform your nurse when you arrive at Short Stay. Do not shave (including legs and underarms) for at least 48 hours prior to the first CHG shower.  Please follow these instructions carefully:  1.  Shower with CHG Soap the night before surgery and the  morning of Surgery.  2.  If you choose to wash your hair, wash your hair first as usual with your  normal  shampoo.  3.  After you shampoo, rinse your hair and body thoroughly to remove the  shampoo.                                        4.  Use CHG as you would any other liquid soap.  You can apply chg directly  to the skin and wash                       Gently with a scrungie or clean washcloth.  5.  Apply the CHG Soap to your body ONLY FROM THE NECK DOWN.   Do not use on face/ open                           Wound or open sores. Avoid contact with eyes, ears mouth and genitals (private parts).                       Wash face,  Genitals (private parts) with your normal soap.             6.  Wash thoroughly, paying special attention to the area where your surgery  will be performed.  7.  Thoroughly rinse your body with  warm water from the neck down.  8.  DO NOT shower/wash with your normal soap after using and rinsing off  the CHG Soap.             9.  Pat yourself dry with a clean towel.            10.  Wear clean pajamas.            11.  Place clean sheets on your bed the night of your first shower and do not  sleep with pets. Day of Surgery : Do not apply any lotions/deodorants the morning of surgery.  Please wear clean clothes to the hospital/surgery center.  FAILURE TO FOLLOW THESE INSTRUCTIONS MAY RESULT IN THE CANCELLATION OF YOUR SURGERY PATIENT SIGNATURE_________________________________  NURSE SIGNATURE__________________________________  ________________________________________________________________________   Natalie Page  An incentive spirometer is a tool that can help keep your lungs clear and active. This tool measures how well you are filling your lungs with each breath. Taking long deep breaths may help reverse or decrease the chance of developing breathing (pulmonary) problems (especially infection) following:  A long period of time when you are unable to move or be active. BEFORE THE PROCEDURE   If the spirometer includes an indicator to show your best effort, your nurse or respiratory therapist will set it to a desired goal.  If possible, sit up straight or lean slightly forward. Try not to slouch.  Hold the incentive spirometer in an upright position. INSTRUCTIONS FOR USE  1. Sit on the edge of your bed if possible, or sit up as far as you can in bed or on a  chair. 2. Hold the incentive spirometer in an upright position. 3. Breathe out normally. 4. Place the mouthpiece in your mouth and seal your lips tightly around it. 5. Breathe in slowly and as deeply as possible, raising the piston or the ball toward the top of the column. 6. Hold your breath for 3-5 seconds or for as long as possible. Allow the piston or ball to fall to the bottom of the column. 7. Remove the  mouthpiece from your mouth and breathe out normally. 8. Rest for a few seconds and repeat Steps 1 through 7 at least 10 times every 1-2 hours when you are awake. Take your time and take a few normal breaths between deep breaths. 9. The spirometer may include an indicator to show your best effort. Use the indicator as a goal to work toward during each repetition. 10. After each set of 10 deep breaths, practice coughing to be sure your lungs are clear. If you have an incision (the cut made at the time of surgery), support your incision when coughing by placing a pillow or rolled up towels firmly against it. Once you are able to get out of bed, walk around indoors and cough well. You may stop using the incentive spirometer when instructed by your caregiver.  RISKS AND COMPLICATIONS  Take your time so you do not get dizzy or light-headed.  If you are in pain, you may need to take or ask for pain medication before doing incentive spirometry. It is harder to take a deep breath if you are having pain. AFTER USE  Rest and breathe slowly and easily.  It can be helpful to keep track of a log of your progress. Your caregiver can provide you with a simple table to help with this. If you are using the spirometer at home, follow these instructions: [place] IF:   You are having difficultly using the spirometer.  You have trouble using the spirometer as often as instructed.  Your pain medication is not giving enough relief while using the spirometer.  You develop fever of 100.5 F (38.1 C) or higher. SEEK IMMEDIATE MEDICAL CARE IF:   You cough up bloody sputum that had not been present before.  You develop fever of 102 F (38.9 C) or greater.  You develop worsening pain at or near the incision site. MAKE SURE YOU:   Understand these instructions.  Will watch your condition.  Will get help right away if you are not doing well or get worse. Document Released: 12/18/2006 Document  Revised: 10/30/2011 Document Reviewed: 02/18/2007 [facility] Patient Information 2014 [place], [place].   ________________________________________________________________________

## 2021-01-10 ENCOUNTER — Encounter (HOSPITAL_COMMUNITY): Payer: Self-pay

## 2021-01-10 ENCOUNTER — Encounter (HOSPITAL_COMMUNITY)
Admission: RE | Admit: 2021-01-10 | Discharge: 2021-01-10 | Disposition: A | Discharge status: Home / Self Care | Payer: Medicare Other | Source: Ambulatory Visit | Attending: Orthopedic Surgery | Admitting: Orthopedic Surgery

## 2021-01-10 ENCOUNTER — Other Ambulatory Visit: Payer: Self-pay

## 2021-01-10 DIAGNOSIS — Z01818 Encounter for other preprocedural examination: Secondary | ICD-10-CM | POA: Diagnosis present

## 2021-01-10 LAB — COMPREHENSIVE METABOLIC PANEL
ALT: 15 U/L (ref 0–44)
AST: 32 U/L (ref 15–41)
Albumin: 4.3 g/dL (ref 3.5–5.0)
Alkaline Phosphatase: 84 U/L (ref 38–126)
Anion gap: 6 (ref 5–15)
BUN: 11 mg/dL (ref 8–23)
CO2: 29 mmol/L (ref 22–32)
Calcium: 9.2 mg/dL (ref 8.9–10.3)
Chloride: 94 mmol/L — ABNORMAL LOW (ref 98–111)
Creatinine, Ser: 0.6 mg/dL (ref 0.44–1.00)
GFR, Estimated: >60 mL/min (ref >60)
Glucose, Bld: 91 mg/dL (ref 70–99)
Potassium: 3.8 mmol/L (ref 3.5–5.1)
Sodium: 129 mmol/L — ABNORMAL LOW (ref 135–145)
Total Bilirubin: 0.8 mg/dL (ref 0.3–1.2)
Total Protein: 6.7 g/dL (ref 6.5–8.1)

## 2021-01-10 LAB — CBC
HCT: 38.6 % (ref 36.0–46.0)
Hemoglobin: 12.4 g/dL (ref 12.0–15.0)
MCH: 28.5 pg (ref 26.0–34.0)
MCHC: 32.1 g/dL (ref 30.0–36.0)
MCV: 88.7 fL (ref 80.0–100.0)
Platelets: 211 10*3/uL (ref 150–400)
RBC: 4.35 MIL/uL (ref 3.87–5.11)
RDW: 15.9 % — ABNORMAL HIGH (ref 11.5–15.5)
WBC: 5.7 10*3/uL (ref 4.0–10.5)
nRBC: 0 % (ref 0.0–0.2)

## 2021-01-10 LAB — URINALYSIS, ROUTINE W REFLEX MICROSCOPIC
Bilirubin Urine: NEGATIVE
Glucose, UA: NEGATIVE mg/dL
Hgb urine dipstick: NEGATIVE
Ketones, ur: NEGATIVE mg/dL
Leukocytes,Ua: NEGATIVE
Nitrite: NEGATIVE
Protein, ur: NEGATIVE mg/dL
Specific Gravity, Urine: 1.011 (ref 1.005–1.030)
pH: 7 (ref 5.0–8.0)

## 2021-01-10 LAB — SURGICAL PCR SCREEN
MRSA, PCR: NEGATIVE
Staphylococcus aureus: NEGATIVE

## 2021-01-10 LAB — PROTIME-INR
INR: 0.9 (ref 0.8–1.2)
Prothrombin Time: 12.6 seconds (ref 11.4–15.2)

## 2021-01-10 NOTE — Progress Notes (Addendum)
COVID Vaccine Completed:Yes Date COVID Vaccine completed:10/22/19-booster 07/23/20 COVID vaccine manufacturer:  Moderna     PCP - Dr. Keturah Barre. Pomposini Cardiologist - Dr. Orpah Greek  Chest x-ray -no  EKG - 01/10/21-epic Stress Test - 12/23/20-epic ECHO - no Cardiac Cath - no Pacemaker/ICD device last checked:NA  Sleep Study - no CPAP -   Fasting Blood Sugar - NA Checks Blood Sugar _____ times a day  Blood Thinner Instructions:ASA 81 Aspirin Instructions:none Last Dose:5/27  Anesthesia review:   Patient denies shortness of breath, fever, cough and chest pain at PAT appointment Yes. Pt reports no SOB with any activities. She has fallen with Rt knee weak. She has an injection into her back on 12/10/20. There is a note on the chart regarding positioning during surgery for her spinal stenosis. 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  Patient verbalized understanding of instructions that were given to them at the PAT appointment. Patient was also instructed that they will need to review over the PAT instructions again at home before surgery.yes

## 2021-01-18 ENCOUNTER — Other Ambulatory Visit (HOSPITAL_COMMUNITY): Payer: Medicare Other

## 2021-01-20 ENCOUNTER — Ambulatory Visit (HOSPITAL_COMMUNITY): Admission: RE | Admit: 2021-01-20 | Payer: Medicare Other | Source: Home / Self Care | Admitting: Orthopedic Surgery

## 2021-01-20 ENCOUNTER — Encounter (HOSPITAL_COMMUNITY): Admission: RE | Payer: Self-pay | Source: Home / Self Care

## 2021-01-20 LAB — TYPE AND SCREEN
ABO/RH(D): O POS
Antibody Screen: NEGATIVE

## 2021-01-20 SURGERY — ARTHROPLASTY, KNEE, TOTAL, USING IMAGELESS COMPUTER-ASSISTED NAVIGATION
Anesthesia: Spinal | Site: Knee | Laterality: Right

## 2021-02-28 ENCOUNTER — Ambulatory Visit: Payer: Self-pay | Admitting: Student

## 2021-03-03 NOTE — Progress Notes (Signed)
DUE TO COVID-19 ONLY ONE VISITOR IS ALLOWED TO COME WITH YOU AND STAY IN THE WAITING ROOM ONLY DURING PRE OP AND PROCEDURE DAY OF SURGERY. THE 1 VISITOR  MAY VISIT WITH YOU AFTER SURGERY IN YOUR PRIVATE ROOM DURING VISITING HOURS ONLY!  YOU NEED TO HAVE A COVID 19 TEST ON__   03/07/2021 _____ @_______ , THIS TEST MUST BE DONE BEFORE SURGERY,  COVID TESTING SITE [address] [place] [address], IT IS ON THE RIGHT GOING OUT [address] APPROXIMATELY  2 MINUTES PAST [employer] ON THE RIGHT. ONCE YOUR COVID TEST IS COMPLETED,  PLEASE BEGIN THE QUARANTINE INSTRUCTIONS AS OUTLINED IN YOUR HANDOUT.                Natalie Page  03/03/2021   Your procedure is scheduled on:     03/10/21   Report to [facility] Main  Entrance   Report to admitting at     [phone]     Call this number if you have problems the morning of surgery [phone]    REMEMBER: NO  SOLID FOOD CANDY OR GUM AFTER MIDNIGHT. CLEAR LIQUIDS UNTIL   0430am       . NOTHING BY MOUTH EXCEPT CLEAR LIQUIDS UNTIL    0430am  . PLEASE FINISH ENSURE DRINK PER SURGEON ORDER  WHICH NEEDS TO BE COMPLETED AT   0430am    .      CLEAR LIQUID DIET   Foods Allowed                                                                    Coffee and tea, regular and decaf                            Fruit ices (not with fruit pulp)                                      Iced Popsicles                                    Carbonated beverages, regular and diet                                    Cranberry, grape and apple juices Sports drinks like Gatorade Lightly seasoned clear broth or consume(fat free) Sugar, honey syrup ___________________________________________________________________      BRUSH YOUR TEETH MORNING OF SURGERY AND RINSE YOUR MOUTH OUT, NO CHEWING GUM CANDY OR MINTS.     Take these medicines the morning of surgery with A SIP OF WATER:  Prilosec, gabapentin, amlodipine, zoloft  DO NOT TAKE ANY  DIABETIC MEDICATIONS DAY OF YOUR SURGERY                               You may not have any metal on your body including hair pins and  piercings  Do not wear jewelry, make-up, lotions, powders or perfumes, deodorant             Do not wear nail polish on your fingernails.  Do not shave  48 hours prior to surgery.              Men may shave face and neck.   Do not bring valuables to the hospital. [place].  Contacts, dentures or bridgework may not be worn into surgery.  Leave suitcase in the car. After surgery it may be brought to your room.     Patients discharged the day of surgery will not be allowed to drive home. IF YOU ARE HAVING SURGERY AND GOING HOME THE SAME DAY, YOU MUST HAVE AN ADULT TO DRIVE YOU HOME AND BE WITH YOU FOR 24 HOURS. YOU MAY GO HOME BY TAXI OR UBER OR ORTHERWISE, BUT AN ADULT MUST ACCOMPANY YOU HOME AND STAY WITH YOU FOR 24 HOURS.  Name and phone number of your driver:  Special Instructions: N/A              Please read over the following fact sheets you were given: _____________________________________________________________________  [facility] - Preparing for Surgery Before surgery, you can play an important role.  Because skin is not sterile, your skin needs to be as free of germs as possible.  You can reduce the number of germs on your skin by washing with CHG (chlorahexidine gluconate) soap before surgery.  CHG is an antiseptic cleaner which kills germs and bonds with the skin to continue killing germs even after washing. Please DO NOT use if you have an allergy to CHG or antibacterial soaps.  If your skin becomes reddened/irritated stop using the CHG and inform your nurse when you arrive at Short Stay. Do not shave (including legs and underarms) for at least 48 hours prior to the first CHG shower.  You may shave your face/neck. Please follow these instructions carefully:  1.  Shower with CHG Soap  the night before surgery and the  morning of Surgery.  2.  If you choose to wash your hair, wash your hair first as usual with your  normal  shampoo.  3.  After you shampoo, rinse your hair and body thoroughly to remove the  shampoo.                           4.  Use CHG as you would any other liquid soap.  You can apply chg directly  to the skin and wash                       Gently with a scrungie or clean washcloth.  5.  Apply the CHG Soap to your body ONLY FROM THE NECK DOWN.   Do not use on face/ open                           Wound or open sores. Avoid contact with eyes, ears mouth and genitals (private parts).                       Wash face,  Genitals (private parts) with your normal soap.             6.  Wash thoroughly, paying special attention to the area where your surgery  will be performed.  7.  Thoroughly rinse your body with warm water from the neck down.  8.  DO NOT shower/wash with your normal soap after using and rinsing off  the CHG Soap.                9.  Pat yourself dry with a clean towel.            10.  Wear clean pajamas.            11.  Place clean sheets on your bed the night of your first shower and do not  sleep with pets. Day of Surgery : Do not apply any lotions/deodorants the morning of surgery.  Please wear clean clothes to the hospital/surgery center.  FAILURE TO FOLLOW THESE INSTRUCTIONS MAY RESULT IN THE CANCELLATION OF YOUR SURGERY PATIENT SIGNATURE_________________________________  NURSE SIGNATURE__________________________________  ________________________________________________________________________

## 2021-03-04 ENCOUNTER — Other Ambulatory Visit (HOSPITAL_COMMUNITY): Payer: Medicare Other

## 2021-03-08 ENCOUNTER — Encounter (HOSPITAL_COMMUNITY)
Admission: RE | Admit: 2021-03-08 | Discharge: 2021-03-08 | Disposition: A | Discharge status: Home / Self Care | Payer: Medicare Other | Source: Ambulatory Visit | Attending: Orthopedic Surgery | Admitting: Orthopedic Surgery

## 2021-03-08 ENCOUNTER — Other Ambulatory Visit: Payer: Self-pay

## 2021-03-08 ENCOUNTER — Ambulatory Visit: Payer: Self-pay | Admitting: Student

## 2021-03-08 ENCOUNTER — Encounter (HOSPITAL_COMMUNITY): Payer: Self-pay

## 2021-03-08 ENCOUNTER — Other Ambulatory Visit (HOSPITAL_COMMUNITY)
Admission: RE | Admit: 2021-03-08 | Discharge: 2021-03-08 | Disposition: A | Discharge status: Home / Self Care | Payer: Medicare Other | Source: Ambulatory Visit | Attending: Orthopedic Surgery | Admitting: Orthopedic Surgery

## 2021-03-08 DIAGNOSIS — Z01812 Encounter for preprocedural laboratory examination: Secondary | ICD-10-CM | POA: Insufficient documentation

## 2021-03-08 DIAGNOSIS — Z20822 Contact with and (suspected) exposure to covid-19: Secondary | ICD-10-CM | POA: Insufficient documentation

## 2021-03-08 HISTORY — DX: Other specified postprocedural states: Z98.890

## 2021-03-08 HISTORY — DX: Anxiety disorder, unspecified: F41.9

## 2021-03-08 HISTORY — DX: Nausea with vomiting, unspecified: R11.2

## 2021-03-08 LAB — URINALYSIS, ROUTINE W REFLEX MICROSCOPIC
Bilirubin Urine: NEGATIVE
Glucose, UA: NEGATIVE mg/dL
Hgb urine dipstick: NEGATIVE
Ketones, ur: NEGATIVE mg/dL
Leukocytes,Ua: NEGATIVE
Nitrite: NEGATIVE
Protein, ur: NEGATIVE mg/dL
Specific Gravity, Urine: 1.011 (ref 1.005–1.030)
pH: 6 (ref 5.0–8.0)

## 2021-03-08 LAB — SURGICAL PCR SCREEN
MRSA, PCR: NEGATIVE
Staphylococcus aureus: NEGATIVE

## 2021-03-08 LAB — CBC
HCT: 41.6 % (ref 36.0–46.0)
Hemoglobin: 13.4 g/dL (ref 12.0–15.0)
MCH: 28.7 pg (ref 26.0–34.0)
MCHC: 32.2 g/dL (ref 30.0–36.0)
MCV: 89.1 fL (ref 80.0–100.0)
Platelets: 220 10*3/uL (ref 150–400)
RBC: 4.67 MIL/uL (ref 3.87–5.11)
RDW: 14.8 % (ref 11.5–15.5)
WBC: 5.7 10*3/uL (ref 4.0–10.5)
nRBC: 0 % (ref 0.0–0.2)

## 2021-03-08 LAB — COMPREHENSIVE METABOLIC PANEL
ALT: 14 U/L (ref 0–44)
AST: 34 U/L (ref 15–41)
Albumin: 4.4 g/dL (ref 3.5–5.0)
Alkaline Phosphatase: 80 U/L (ref 38–126)
Anion gap: 8 (ref 5–15)
BUN: 11 mg/dL (ref 8–23)
CO2: 28 mmol/L (ref 22–32)
Calcium: 9.2 mg/dL (ref 8.9–10.3)
Chloride: 99 mmol/L (ref 98–111)
Creatinine, Ser: 0.52 mg/dL (ref 0.44–1.00)
GFR, Estimated: >60 mL/min (ref >60)
Glucose, Bld: 77 mg/dL (ref 70–99)
Potassium: 3.7 mmol/L (ref 3.5–5.1)
Sodium: 135 mmol/L (ref 135–145)
Total Bilirubin: 0.6 mg/dL (ref 0.3–1.2)
Total Protein: 6.8 g/dL (ref 6.5–8.1)

## 2021-03-08 LAB — PROTIME-INR
INR: 0.9 (ref 0.8–1.2)
Prothrombin Time: 12.1 seconds (ref 11.4–15.2)

## 2021-03-08 LAB — SARS CORONAVIRUS 2 (TAT 6-24 HRS): SARS Coronavirus 2: NEGATIVE

## 2021-03-08 NOTE — Progress Notes (Signed)
Anesthesia Chart Review   Case: [id number] Date/Time: 03/10/21 0715   Procedure: COMPUTER ASSISTED TOTAL KNEE ARTHROPLASTY (Right: Knee)   Anesthesia type: Spinal   Pre-op diagnosis: right knee osteoarthritis   Location: [facility] ROOM 08 / [facility]   Surgeons: Rod Can, MD       DISCUSSION:75 y.o. never smoker with h/o PONV, GERD, HTN, right knee OA scheduled for above procedure 03/10/2021 with Dr. Rod Can.   Stress Test 12/27/2020 with no evidence of infarct, low risk study.  VS: BP (!) 158/67   Pulse 70   Temp 36.8 C (Oral)   Resp 16   Ht 5\' 4"  (1.626 m)   Wt 62.1 kg   SpO2 100%   BMI 23.52 kg/m   PROVIDERS: Pomposini, Cherly Anderson, MD is PCP   Orpah Greek, MD is Cardiologist  LABS: Labs reviewed: Acceptable for surgery. (all labs ordered are listed, but only abnormal results are displayed)  Labs Reviewed  SURGICAL PCR SCREEN  CBC  COMPREHENSIVE METABOLIC PANEL  URINALYSIS, ROUTINE W REFLEX MICROSCOPIC  PROTIME-INR  TYPE AND SCREEN     IMAGES:   EKG: 01/10/2021 Rate 61 bpm  NSR  CV: Echo 06/24/2019 Normal left ventricular systolic function. LVEF 55-60% Normal right ventricular function  Mild left ventricular hypertrophy Normal diastolic function Normal chamber dimensions No significant valvular abnormalities noted Normal estimated PA systolic pressure No pericardial perfusion  No other significant findings.  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 Algis Greenhouse: small cecal polyp and small transverse colon polyp, left colon diverticulosis, tubular adenoma   COLONOSCOPY WITH PROPOFOL N/A 11/01/2017   Procedure: COLONOSCOPY WITH PROPOFOL;  Surgeon: Daneil Dolin, MD;  [facility];  Service: Endoscopy;  Laterality: N/A;  12:30pm   ESOPHAGOGASTRODUODENOSCOPY  03/2012   Dr. Algis Greenhouse: large hiatal hernia, otherwise normal   EYE SURGERY Right    eyelid   HAND SURGERY Bilateral 2001   KNEE ARTHROPLASTY Left 12/18/2016   Procedure: LEFT TOTAL KNEE ARTHROPLASTY WITH COMPUTER NAVIGATION;  Surgeon: Rod Can, MD;  Location: [place];  Service: Orthopedics;  Laterality: Left;  Dr. request RNFA   TONSILLECTOMY     TONSILLECTOMY AND ADENOIDECTOMY      MEDICATIONS:  acetaminophen (TYLENOL) 650 MG CR tablet   amLODipine (NORVASC) 2.5 MG tablet   aspirin 81 MG chewable tablet   aspirin EC 81 MG tablet   atorvastatin (LIPITOR) 10 MG tablet   B Complex Vitamins (B COMPLEX PO)   benzonatate (TESSALON) 200 MG capsule   ciclopirox (LOPROX) 0.77 % cream   clindamycin (CLEOCIN T) 1 % lotion   clobetasol (TEMOVATE) 0.05 % external solution   COLLAGEN  PO   docusate sodium (COLACE) 100 MG capsule   famotidine-calcium carbonate-magnesium hydroxide (PEPCID COMPLETE) 10-800-165 MG chewable tablet   fluticasone (FLONASE) 50 MCG/ACT nasal spray   folic acid (FOLVITE) 672 MCG tablet   furosemide (LASIX) 20 MG tablet   gabapentin (NEURONTIN) 800 MG tablet   hydrochlorothiazide (HYDRODIURIL) 25 MG tablet   hydroxychloroquine (PLAQUENIL) 200 MG tablet   ipratropium (ATROVENT) 0.03 % nasal spray   Melatonin 10 MG CAPS   meloxicam (MOBIC) 15 MG tablet   Multiple Vitamins-Minerals (PRESERVISION AREDS 2 PO)   olmesartan (BENICAR) 40 MG tablet   omeprazole (PRILOSEC) 20 MG  capsule   OYSCO 500 + D 500-200 MG-UNIT TABS   sertraline (ZOLOFT) 100 MG tablet   sodium chloride (OCEAN) 0.65 % SOLN nasal spray   triamcinolone (KENALOG) 0.1 %   No current facility-administered medications for this encounter.    Konrad Felix, PA-C [facility] Pre-Surgical Testing [phone]

## 2021-03-08 NOTE — H&P (View-Only) (Signed)
TOTAL KNEE ADMISSION H&P  Patient is being admitted for right total knee arthroplasty.  Subjective:  Chief Complaint:right knee pain.  HPI: Natalie Page, 75 y.o. female, has a history of pain and functional disability in the right knee due to arthritis and has failed non-surgical conservative treatments for greater than 12 weeks to includeNSAID's and/or analgesics and corticosteriod injections.  Onset of symptoms was gradual, starting 3 years ago with gradually worsening course since that time. The patient noted no past surgery on the right knee(s).  Patient currently rates pain in the right knee(s) at 8 out of 10 with activity. Patient has worsening of pain with activity and weight bearing, pain that interferes with activities of daily living, pain with passive range of motion, and joint swelling.  Patient has evidence of subchondral cysts, subchondral sclerosis, and joint space narrowing by imaging studies. There is no active infection.  Patient Active Problem List   Diagnosis Date Noted   Tremor, essential 02/10/2020   Tremor 11/05/2018   Normal weight, pediatric, BMI 5th to 84th percentile for age 72/17/2019   Carotid atherosclerosis 05/09/2018   Hyperlipidemia 05/09/2018   Pain in joint of left elbow 12/17/2017   On long term drug therapy 11/13/2017   Mammography less than 12 months ago 11/13/2017   Medicare annual wellness visit, initial 11/13/2017   History of colonic polyps 10/03/2017   GERD (gastroesophageal reflux disease) 10/03/2017   Encounter for routine adult health examination 07/03/2017   Encounter for hepatitis C screening test for low risk patient 07/03/2017   Encounter for screening colonoscopy 07/03/2017   Screening for osteoporosis 07/03/2017   Osteoarthritis of left knee 12/18/2016   Overweight 07/26/2016   Restless leg syndrome 07/25/2016   Pain in both lower extremities 07/25/2016   Paresthesia 07/25/2016   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    Endometriosis 11/03/2015   Adenomatous polyp of colon 11/03/2015   Benign essential hypertension 11/03/2015   Cervical disc disorder 11/03/2015   Diverticular disease 11/03/2015   Multinodular goiter 11/03/2015   Osteopenia 11/03/2015   Paresthesia of foot 11/03/2015   Partial deafness 11/03/2015   Osteoarthritis 11/03/2015   Past Medical History:  Diagnosis Date   Adenomatous polyp of colon    Anxiety    Cancer ([place])    on back- found to have basal ca   Cervical disc disorder    Diverticular disease    Endometriosis    GERD (gastroesophageal reflux disease)    H/O cardiovascular stress test 20 yrs. ago- 1990's   told that it was normal   Hypertension    Multinodular goiter    Osteoarthritis    Osteopenia    Partial deafness    Peripheral neuropathy    PONV (postoperative nausea and vomiting)    TMJ (dislocation of temporomandibular joint)    Tremor, essential 02/10/2020   Varicose vein of leg    bilateral    Past Surgical History:  Procedure Laterality Date   CATARACT EXTRACTION Bilateral    COLONOSCOPY  2013   Dr. Algis Greenhouse: small cecal polyp and small transverse colon polyp, left colon diverticulosis, tubular adenoma   COLONOSCOPY WITH PROPOFOL N/A 11/01/2017   Procedure: COLONOSCOPY WITH PROPOFOL;  Surgeon: Daneil Dolin, MD;  [facility];  Service: Endoscopy;  Laterality: N/A;  12:30pm   ESOPHAGOGASTRODUODENOSCOPY  03/2012   Dr. Algis Greenhouse: large hiatal hernia, otherwise normal   EYE SURGERY Right    eyelid   HAND SURGERY Bilateral 2001   KNEE ARTHROPLASTY Left 12/18/2016  Procedure: LEFT TOTAL KNEE ARTHROPLASTY WITH COMPUTER NAVIGATION;  Surgeon: Rod Can, MD;  Location: [place];  Service: Orthopedics;  Laterality: Left;  Dr. request RNFA   TONSILLECTOMY     TONSILLECTOMY AND ADENOIDECTOMY      Current Outpatient Medications  Medication Sig Dispense Refill Last Dose   acetaminophen (TYLENOL) 650 MG CR tablet Take 650 mg by mouth every 8  (eight) hours as needed for pain.      amLODipine (NORVASC) 2.5 MG tablet Take 2.5 mg by mouth daily.      aspirin 81 MG chewable tablet Chew 1 tablet (81 mg total) by mouth 2 (two) times daily. (Patient not taking: No sig reported) 60 tablet 1    aspirin EC 81 MG tablet Take 81 mg by mouth daily. Swallow whole.      atorvastatin (LIPITOR) 10 MG tablet Take 10 mg by mouth daily.      B Complex Vitamins (B COMPLEX PO) Take 1 capsule by mouth every other day.      benzonatate (TESSALON) 200 MG capsule Take 200 mg by mouth 3 (three) times daily as needed for cough.      ciclopirox (LOPROX) 0.77 % cream Apply 1 application topically daily as needed for rash.      clindamycin (CLEOCIN T) 1 % lotion Apply 1 application topically 2 (two) times daily as needed (acne).      clobetasol (TEMOVATE) 0.05 % external solution Apply 1 application topically daily as needed for itching.      COLLAGEN PO Take 2 capsules by mouth daily.      docusate sodium (COLACE) 100 MG capsule Take 100 mg by mouth daily as needed for mild constipation.      famotidine-calcium carbonate-magnesium hydroxide (PEPCID COMPLETE) 10-800-165 MG chewable tablet Chew 1 tablet by mouth at bedtime as needed (indigestion).      fluticasone (FLONASE) 50 MCG/ACT nasal spray Place 1 spray into both nostrils daily as needed for allergies.      folic acid (FOLVITE) 947 MCG tablet Take 800 mcg by mouth daily.      furosemide (LASIX) 20 MG tablet Take 20 mg by mouth daily as needed for fluid.      gabapentin (NEURONTIN) 800 MG tablet Take 1 tablet (800 mg total) by mouth 3 (three) times daily. (Patient taking differently: Take 1,600 mg by mouth 2 (two) times daily.) 270 tablet 4    hydrochlorothiazide (HYDRODIURIL) 25 MG tablet Take 12.5 mg by mouth daily.      hydroxychloroquine (PLAQUENIL) 200 MG tablet Take 200 mg by mouth 2 (two) times daily.      ipratropium (ATROVENT) 0.03 % nasal spray Place 2 sprays into both nostrils 2 (two) times daily as  needed for rhinitis.      Melatonin 10 MG CAPS Take 10 mg by mouth at bedtime as needed (sleep).      meloxicam (MOBIC) 15 MG tablet Take 15 mg by mouth every evening.      Multiple Vitamins-Minerals (PRESERVISION AREDS 2 PO) Take 1 capsule by mouth in the morning and at bedtime.      olmesartan (BENICAR) 40 MG tablet Take 40 mg by mouth daily.      omeprazole (PRILOSEC) 20 MG capsule Take 20 mg by mouth daily.       OYSCO 500 + D 500-200 MG-UNIT TABS Take 1 tablet by mouth 2 (two) times daily.      sertraline (ZOLOFT) 100 MG tablet Take 100 mg by mouth daily.  sodium chloride (OCEAN) 0.65 % SOLN nasal spray Place 1 spray into both nostrils as needed for congestion.      triamcinolone (KENALOG) 0.1 % Apply 1 application topically 2 (two) times daily as needed for itching.      No current facility-administered medications for this visit.   No Known Allergies  Social History   Tobacco Use   Smoking status: Never   Smokeless tobacco: Never  Substance Use Topics   Alcohol use: Yes    Comment: 2 glasses three times weekly.    Family History  Problem Relation Age of Onset   Stroke Mother    COPD Father    Cancer Father        Unsure of type - "on his chin"   Throat cancer Sister    Stomach cancer Brother    Breast cancer Sister    Colon cancer Neg Hx      Review of Systems  Musculoskeletal:  Positive for arthralgias.  All other systems reviewed and are negative.  Objective:  Physical Exam Vitals reviewed.  HENT:     Head: Normocephalic.  Eyes:     Pupils: Pupils are equal, round, and reactive to light.  Cardiovascular:     Rate and Rhythm: Normal rate and regular rhythm.     Pulses: Normal pulses.  Pulmonary:     Effort: Pulmonary effort is normal.  Abdominal:     Palpations: Abdomen is soft.     Tenderness: There is no abdominal tenderness.  Genitourinary:    Comments: Deferred Musculoskeletal:        General: Tenderness present.     Cervical back: Normal  range of motion.  Skin:    General: Skin is warm and dry.  Neurological:     Mental Status: She is alert and oriented to person, place, and time.  Psychiatric:        Behavior: Behavior normal.    Vital signs in last 24 hours: @VSRANGES @  Labs:   Estimated body mass index is 23.52 kg/m as calculated from the following:   Height as of an earlier encounter on 03/08/21: 5\' 4"  (1.626 m).   Weight as of an earlier encounter on 03/08/21: 62.1 kg.   Imaging Review Plain radiographs demonstrate severe degenerative joint disease of the right knee(s). The bone quality appears to be adequate for age and reported activity level.      Assessment/Plan:  End stage arthritis, right knee   The patient history, physical examination, clinical judgment of the provider and imaging studies are consistent with end stage degenerative joint disease of the right knee(s) and total knee arthroplasty is deemed medically necessary. The treatment options including medical management, injection therapy arthroscopy and arthroplasty were discussed at length. The risks and benefits of total knee arthroplasty were presented and reviewed. The risks due to aseptic loosening, infection, stiffness, patella tracking problems, thromboembolic complications and other imponderables were discussed. The patient acknowledged the explanation, agreed to proceed with the plan and consent was signed. Patient is being admitted for inpatient treatment for surgery, pain control, PT, OT, prophylactic antibiotics, VTE prophylaxis, progressive ambulation and ADL's and discharge planning. The patient is planning to be discharged  home after overnight observation     Patient's anticipated LOS is less than 2 midnights, meeting these requirements: - Lives within 1 hour of care - Has a competent adult at home to recover with post-op recover - NO history of  - Chronic pain requiring opiods  - Diabetes  -  Coronary Artery Disease  - Heart  failure  - Heart attack  - Stroke  - DVT/VTE  - Cardiac arrhythmia  - Respiratory Failure/COPD  - Renal failure  - Anemia  - Advanced Liver disease

## 2021-03-08 NOTE — H&P (Signed)
TOTAL KNEE ADMISSION H&P  Patient is being admitted for right total knee arthroplasty.  Subjective:  Chief Complaint:right knee pain.  HPI: [name], 75 y.o. female, has a history of pain and functional disability in the right knee due to arthritis and has failed non-surgical conservative treatments for greater than 12 weeks to includeNSAID's and/or analgesics and corticosteriod injections.  Onset of symptoms was gradual, starting 3 years ago with gradually worsening course since that time. The patient noted no past surgery on the right knee(s).  Patient currently rates pain in the right knee(s) at 8 out of 10 with activity. Patient has worsening of pain with activity and weight bearing, pain that interferes with activities of daily living, pain with passive range of motion, and joint swelling.  Patient has evidence of subchondral cysts, subchondral sclerosis, and joint space narrowing by imaging studies. There is no active infection.  Patient Active Problem List   Diagnosis Date Noted   Tremor, essential 02/10/2020   Tremor 11/05/2018   Normal weight, pediatric, BMI 5th to 84th percentile for age 82/17/2019   Carotid atherosclerosis 05/09/2018   Hyperlipidemia 05/09/2018   Pain in joint of left elbow 12/17/2017   On long term drug therapy 11/13/2017   Mammography less than 12 months ago 11/13/2017   Medicare annual wellness visit, initial 11/13/2017   History of colonic polyps 10/03/2017   GERD (gastroesophageal reflux disease) 10/03/2017   Encounter for routine adult health examination 07/03/2017   Encounter for hepatitis C screening test for low risk patient 07/03/2017   Encounter for screening colonoscopy 07/03/2017   Screening for osteoporosis 07/03/2017   Osteoarthritis of left knee 12/18/2016   Overweight 07/26/2016   Restless leg syndrome 07/25/2016   Pain in both lower extremities 07/25/2016   Paresthesia 07/25/2016   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    Endometriosis 11/03/2015   Adenomatous polyp of colon 11/03/2015   Benign essential hypertension 11/03/2015   Cervical disc disorder 11/03/2015   Diverticular disease 11/03/2015   Multinodular goiter 11/03/2015   Osteopenia 11/03/2015   Paresthesia of foot 11/03/2015   Partial deafness 11/03/2015   Osteoarthritis 11/03/2015   Past Medical History:  Diagnosis Date   Adenomatous polyp of colon    Anxiety    Cancer ([place])    on back- found to have basal ca   Cervical disc disorder    Diverticular disease    Endometriosis    GERD (gastroesophageal reflux disease)    H/O cardiovascular stress test 20 yrs. ago- 1990's   told that it was normal   Hypertension    Multinodular goiter    Osteoarthritis    Osteopenia    Partial deafness    Peripheral neuropathy    PONV (postoperative nausea and vomiting)    TMJ (dislocation of temporomandibular joint)    Tremor, essential 02/10/2020   Varicose vein of leg    bilateral    Past Surgical History:  Procedure Laterality Date   CATARACT EXTRACTION Bilateral    COLONOSCOPY  2013   Dr. Algis Greenhouse: small cecal polyp and small transverse colon polyp, left colon diverticulosis, tubular adenoma   COLONOSCOPY WITH PROPOFOL N/A 11/01/2017   Procedure: COLONOSCOPY WITH PROPOFOL;  Surgeon: Daneil Dolin, MD;  [facility];  Service: Endoscopy;  Laterality: N/A;  12:30pm   ESOPHAGOGASTRODUODENOSCOPY  03/2012   Dr. Algis Greenhouse: large hiatal hernia, otherwise normal   EYE SURGERY Right    eyelid   HAND SURGERY Bilateral 2001   KNEE ARTHROPLASTY Left 12/18/2016  Procedure: LEFT TOTAL KNEE ARTHROPLASTY WITH COMPUTER NAVIGATION;  Surgeon: Rod Can, MD;  Location: [place];  Service: Orthopedics;  Laterality: Left;  Dr. request RNFA   TONSILLECTOMY     TONSILLECTOMY AND ADENOIDECTOMY      Current Outpatient Medications  Medication Sig Dispense Refill Last Dose   acetaminophen (TYLENOL) 650 MG CR tablet Take 650 mg by mouth every 8  (eight) hours as needed for pain.      amLODipine (NORVASC) 2.5 MG tablet Take 2.5 mg by mouth daily.      aspirin 81 MG chewable tablet Chew 1 tablet (81 mg total) by mouth 2 (two) times daily. (Patient not taking: No sig reported) 60 tablet 1    aspirin EC 81 MG tablet Take 81 mg by mouth daily. Swallow whole.      atorvastatin (LIPITOR) 10 MG tablet Take 10 mg by mouth daily.      B Complex Vitamins (B COMPLEX PO) Take 1 capsule by mouth every other day.      benzonatate (TESSALON) 200 MG capsule Take 200 mg by mouth 3 (three) times daily as needed for cough.      ciclopirox (LOPROX) 0.77 % cream Apply 1 application topically daily as needed for rash.      clindamycin (CLEOCIN T) 1 % lotion Apply 1 application topically 2 (two) times daily as needed (acne).      clobetasol (TEMOVATE) 0.05 % external solution Apply 1 application topically daily as needed for itching.      COLLAGEN PO Take 2 capsules by mouth daily.      docusate sodium (COLACE) 100 MG capsule Take 100 mg by mouth daily as needed for mild constipation.      famotidine-calcium carbonate-magnesium hydroxide (PEPCID COMPLETE) 10-800-165 MG chewable tablet Chew 1 tablet by mouth at bedtime as needed (indigestion).      fluticasone (FLONASE) 50 MCG/ACT nasal spray Place 1 spray into both nostrils daily as needed for allergies.      folic acid (FOLVITE) 858 MCG tablet Take 800 mcg by mouth daily.      furosemide (LASIX) 20 MG tablet Take 20 mg by mouth daily as needed for fluid.      gabapentin (NEURONTIN) 800 MG tablet Take 1 tablet (800 mg total) by mouth 3 (three) times daily. (Patient taking differently: Take 1,600 mg by mouth 2 (two) times daily.) 270 tablet 4    hydrochlorothiazide (HYDRODIURIL) 25 MG tablet Take 12.5 mg by mouth daily.      hydroxychloroquine (PLAQUENIL) 200 MG tablet Take 200 mg by mouth 2 (two) times daily.      ipratropium (ATROVENT) 0.03 % nasal spray Place 2 sprays into both nostrils 2 (two) times daily as  needed for rhinitis.      Melatonin 10 MG CAPS Take 10 mg by mouth at bedtime as needed (sleep).      meloxicam (MOBIC) 15 MG tablet Take 15 mg by mouth every evening.      Multiple Vitamins-Minerals (PRESERVISION AREDS 2 PO) Take 1 capsule by mouth in the morning and at bedtime.      olmesartan (BENICAR) 40 MG tablet Take 40 mg by mouth daily.      omeprazole (PRILOSEC) 20 MG capsule Take 20 mg by mouth daily.       OYSCO 500 + D 500-200 MG-UNIT TABS Take 1 tablet by mouth 2 (two) times daily.      sertraline (ZOLOFT) 100 MG tablet Take 100 mg by mouth daily.  sodium chloride (OCEAN) 0.65 % SOLN nasal spray Place 1 spray into both nostrils as needed for congestion.      triamcinolone (KENALOG) 0.1 % Apply 1 application topically 2 (two) times daily as needed for itching.      No current facility-administered medications for this visit.   No Known Allergies  Social History   Tobacco Use   Smoking status: Never   Smokeless tobacco: Never  Substance Use Topics   Alcohol use: Yes    Comment: 2 glasses three times weekly.    Family History  Problem Relation Age of Onset   Stroke Mother    COPD Father    Cancer Father        Unsure of type - "on his chin"   Throat cancer Sister    Stomach cancer Brother    Breast cancer Sister    Colon cancer Neg Hx      Review of Systems  Musculoskeletal:  Positive for arthralgias.  All other systems reviewed and are negative.  Objective:  Physical Exam Vitals reviewed.  HENT:     Head: Normocephalic.  Eyes:     Pupils: Pupils are equal, round, and reactive to light.  Cardiovascular:     Rate and Rhythm: Normal rate and regular rhythm.     Pulses: Normal pulses.  Pulmonary:     Effort: Pulmonary effort is normal.  Abdominal:     Palpations: Abdomen is soft.     Tenderness: There is no abdominal tenderness.  Genitourinary:    Comments: Deferred Musculoskeletal:        General: Tenderness present.     Cervical back: Normal  range of motion.  Skin:    General: Skin is warm and dry.  Neurological:     Mental Status: She is alert and oriented to person, place, and time.  Psychiatric:        Behavior: Behavior normal.    Vital signs in last 24 hours: @VSRANGES @  Labs:   Estimated body mass index is 23.52 kg/m as calculated from the following:   Height as of an earlier encounter on 03/08/21: 5\' 4"  (1.626 m).   Weight as of an earlier encounter on 03/08/21: 62.1 kg.   Imaging Review Plain radiographs demonstrate severe degenerative joint disease of the right knee(s). The bone quality appears to be adequate for age and reported activity level.      Assessment/Plan:  End stage arthritis, right knee   The patient history, physical examination, clinical judgment of the provider and imaging studies are consistent with end stage degenerative joint disease of the right knee(s) and total knee arthroplasty is deemed medically necessary. The treatment options including medical management, injection therapy arthroscopy and arthroplasty were discussed at length. The risks and benefits of total knee arthroplasty were presented and reviewed. The risks due to aseptic loosening, infection, stiffness, patella tracking problems, thromboembolic complications and other imponderables were discussed. The patient acknowledged the explanation, agreed to proceed with the plan and consent was signed. Patient is being admitted for inpatient treatment for surgery, pain control, PT, OT, prophylactic antibiotics, VTE prophylaxis, progressive ambulation and ADL's and discharge planning. The patient is planning to be discharged  home after overnight observation     Patient's anticipated LOS is less than 2 midnights, meeting these requirements: - Lives within 1 hour of care - Has a competent adult at home to recover with post-op recover - NO history of  - Chronic pain requiring opiods  - Diabetes  -  Coronary Artery Disease  - Heart  failure  - Heart attack  - Stroke  - DVT/VTE  - Cardiac arrhythmia  - Respiratory Failure/COPD  - Renal failure  - Anemia  - Advanced Liver disease

## 2021-03-08 NOTE — Progress Notes (Addendum)
Anesthesia Review:  PCP: Dr Quillian Quince pomposini in [place] and requested most recent ov note.   Cardiologist : DR Sabra Heck in [facility]  Requested  most recent OV note, ekg, stres and echo.  OV note 08/27/20 on chart  Chest x-ray : EKG : 01/10/21 , 12/2019 on chart  Echo :07/2019 on chart  Stress test: 12/27/2020 on chart  Cardiac Cath :  Activity level:  can do a flgiht of stairs iwthout difficulty  Sleep Study/ CPAP : none  Fasting Blood Sugar :      / Checks Blood Sugar -- times a day:   Blood Thinner/ Instructions /Last Dose: ASA / Instructions/ Last Dose :   81 mg aspirin

## 2021-03-09 ENCOUNTER — Encounter (HOSPITAL_COMMUNITY): Payer: Self-pay | Admitting: Orthopedic Surgery

## 2021-03-10 ENCOUNTER — Inpatient Hospital Stay (HOSPITAL_COMMUNITY)
Admission: RE | Admit: 2021-03-10 | Discharge: 2021-03-12 | DRG: 470 | Disposition: A | Discharge status: Home / Self Care | Payer: Medicare Other | Source: Ambulatory Visit | Attending: Orthopedic Surgery | Admitting: Orthopedic Surgery

## 2021-03-10 ENCOUNTER — Ambulatory Visit (HOSPITAL_COMMUNITY): Payer: Medicare Other | Admitting: Anesthesiology

## 2021-03-10 ENCOUNTER — Ambulatory Visit (HOSPITAL_COMMUNITY): Payer: Medicare Other

## 2021-03-10 ENCOUNTER — Encounter (HOSPITAL_COMMUNITY): Payer: Self-pay | Admitting: Orthopedic Surgery

## 2021-03-10 ENCOUNTER — Encounter (HOSPITAL_COMMUNITY)
Admission: RE | Disposition: A | Discharge status: Home / Self Care | Payer: Self-pay | Source: Ambulatory Visit | Attending: Orthopedic Surgery

## 2021-03-10 ENCOUNTER — Other Ambulatory Visit: Payer: Self-pay

## 2021-03-10 ENCOUNTER — Ambulatory Visit (HOSPITAL_COMMUNITY): Payer: Medicare Other | Admitting: Physician Assistant

## 2021-03-10 DIAGNOSIS — K219 Gastro-esophageal reflux disease without esophagitis: Secondary | ICD-10-CM | POA: Diagnosis present

## 2021-03-10 DIAGNOSIS — G2581 Restless legs syndrome: Secondary | ICD-10-CM | POA: Diagnosis present

## 2021-03-10 DIAGNOSIS — F419 Anxiety disorder, unspecified: Secondary | ICD-10-CM | POA: Diagnosis present

## 2021-03-10 DIAGNOSIS — Z823 Family history of stroke: Secondary | ICD-10-CM

## 2021-03-10 DIAGNOSIS — Z825 Family history of asthma and other chronic lower respiratory diseases: Secondary | ICD-10-CM

## 2021-03-10 DIAGNOSIS — M25522 Pain in left elbow: Secondary | ICD-10-CM | POA: Diagnosis present

## 2021-03-10 DIAGNOSIS — M1711 Unilateral primary osteoarthritis, right knee: Principal | ICD-10-CM | POA: Diagnosis present

## 2021-03-10 DIAGNOSIS — Z7982 Long term (current) use of aspirin: Secondary | ICD-10-CM

## 2021-03-10 DIAGNOSIS — Z79899 Other long term (current) drug therapy: Secondary | ICD-10-CM

## 2021-03-10 DIAGNOSIS — E785 Hyperlipidemia, unspecified: Secondary | ICD-10-CM | POA: Diagnosis present

## 2021-03-10 DIAGNOSIS — I10 Essential (primary) hypertension: Secondary | ICD-10-CM | POA: Diagnosis present

## 2021-03-10 DIAGNOSIS — Z20822 Contact with and (suspected) exposure to covid-19: Secondary | ICD-10-CM | POA: Diagnosis present

## 2021-03-10 DIAGNOSIS — Z791 Long term (current) use of non-steroidal anti-inflammatories (NSAID): Secondary | ICD-10-CM

## 2021-03-10 DIAGNOSIS — Z96652 Presence of left artificial knee joint: Secondary | ICD-10-CM | POA: Diagnosis present

## 2021-03-10 HISTORY — PX: KNEE ARTHROPLASTY: SHX992

## 2021-03-10 LAB — TYPE AND SCREEN
ABO/RH(D): O POS
Antibody Screen: NEGATIVE

## 2021-03-10 SURGERY — ARTHROPLASTY, KNEE, TOTAL, USING IMAGELESS COMPUTER-ASSISTED NAVIGATION
Anesthesia: General | Site: Knee | Laterality: Right

## 2021-03-10 MED ORDER — PROPOFOL 10 MG/ML IV BOLUS
INTRAVENOUS | Status: AC
  Filled 2021-03-10: qty 40

## 2021-03-10 MED ORDER — CELECOXIB 200 MG PO CAPS
200.0000 mg | ORAL_CAPSULE | Freq: Two times a day (BID) | ORAL | Status: DC
  Administered 2021-03-10 – 2021-03-11 (×4): 200 mg via ORAL
  Filled 2021-03-10 (×5): qty 1

## 2021-03-10 MED ORDER — FENTANYL CITRATE (PF) 100 MCG/2ML IJ SOLN
25.0000 ug | INTRAMUSCULAR | Status: DC | PRN
  Administered 2021-03-10: 50 ug via INTRAVENOUS

## 2021-03-10 MED ORDER — HYDROCODONE-ACETAMINOPHEN 5-325 MG PO TABS
1.0000 | ORAL_TABLET | ORAL | Status: DC | PRN
  Administered 2021-03-10 – 2021-03-12 (×6): 2 via ORAL
  Filled 2021-03-10 (×6): qty 2

## 2021-03-10 MED ORDER — SERTRALINE HCL 100 MG PO TABS
100.0000 mg | ORAL_TABLET | Freq: Every day | ORAL | Status: DC
  Administered 2021-03-11 – 2021-03-12 (×2): 100 mg via ORAL
  Filled 2021-03-10 (×2): qty 1

## 2021-03-10 MED ORDER — HYDROCHLOROTHIAZIDE 25 MG PO TABS
12.5000 mg | ORAL_TABLET | Freq: Every day | ORAL | Status: DC
  Administered 2021-03-11 – 2021-03-12 (×2): 12.5 mg via ORAL
  Filled 2021-03-10 (×2): qty 1

## 2021-03-10 MED ORDER — FENTANYL CITRATE (PF) 100 MCG/2ML IJ SOLN
INTRAMUSCULAR | Status: DC | PRN
  Administered 2021-03-10: 50 ug via INTRAVENOUS
  Administered 2021-03-10 (×6): 25 ug via INTRAVENOUS

## 2021-03-10 MED ORDER — SODIUM CHLORIDE 0.9 % IV SOLN: INTRAVENOUS | Status: DC

## 2021-03-10 MED ORDER — FOLIC ACID 1 MG PO TABS
1000.0000 ug | ORAL_TABLET | Freq: Every day | ORAL | Status: DC
  Administered 2021-03-10 – 2021-03-12 (×3): 1 mg via ORAL
  Filled 2021-03-10 (×3): qty 1

## 2021-03-10 MED ORDER — PANTOPRAZOLE SODIUM 40 MG PO TBEC
40.0000 mg | DELAYED_RELEASE_TABLET | Freq: Every day | ORAL | Status: DC
  Administered 2021-03-11 – 2021-03-12 (×2): 40 mg via ORAL
  Filled 2021-03-10 (×2): qty 1

## 2021-03-10 MED ORDER — ACETAMINOPHEN 325 MG PO TABS: 325.0000 mg | ORAL_TABLET | Freq: Four times a day (QID) | ORAL | Status: DC | PRN

## 2021-03-10 MED ORDER — ACETAMINOPHEN 10 MG/ML IV SOLN
1000.0000 mg | Freq: Once | INTRAVENOUS | Status: AC
  Administered 2021-03-10: 1000 mg via INTRAVENOUS
  Filled 2021-03-10: qty 100

## 2021-03-10 MED ORDER — ONDANSETRON HCL 4 MG PO TABS: 4.0000 mg | ORAL_TABLET | Freq: Four times a day (QID) | ORAL | Status: DC | PRN

## 2021-03-10 MED ORDER — METOCLOPRAMIDE HCL 5 MG/ML IJ SOLN
5.0000 mg | Freq: Three times a day (TID) | INTRAMUSCULAR | Status: DC | PRN
Start: 2021-03-10 — End: 2021-03-12

## 2021-03-10 MED ORDER — ORAL CARE MOUTH RINSE: 15.0000 mL | Freq: Once | OROMUCOSAL | Status: AC

## 2021-03-10 MED ORDER — POVIDONE-IODINE 10 % EX SWAB
2.0000 "application " | Freq: Once | CUTANEOUS | Status: AC
  Administered 2021-03-10: 2 via TOPICAL

## 2021-03-10 MED ORDER — FLUTICASONE PROPIONATE 50 MCG/ACT NA SUSP: 1.0000 | Freq: Every day | NASAL | Status: DC | PRN

## 2021-03-10 MED ORDER — EPHEDRINE 5 MG/ML INJ
INTRAVENOUS | Status: AC
  Filled 2021-03-10: qty 10

## 2021-03-10 MED ORDER — CHLORHEXIDINE GLUCONATE 0.12 % MT SOLN
15.0000 mL | Freq: Once | OROMUCOSAL | Status: AC
  Administered 2021-03-10: 15 mL via OROMUCOSAL

## 2021-03-10 MED ORDER — LACTATED RINGERS IV SOLN: INTRAVENOUS | Status: DC

## 2021-03-10 MED ORDER — BUPIVACAINE-EPINEPHRINE 0.25% -1:200000 IJ SOLN
INTRAMUSCULAR | Status: DC | PRN
  Administered 2021-03-10: 30 mL

## 2021-03-10 MED ORDER — LIDOCAINE 2% (20 MG/ML) 5 ML SYRINGE
INTRAMUSCULAR | Status: AC
  Filled 2021-03-10: qty 10

## 2021-03-10 MED ORDER — MIDAZOLAM HCL 5 MG/5ML IJ SOLN
INTRAMUSCULAR | Status: DC | PRN
Start: 2021-03-10 — End: 2021-03-10
  Administered 2021-03-10 (×2): 1 mg via INTRAVENOUS

## 2021-03-10 MED ORDER — MORPHINE SULFATE (PF) 2 MG/ML IV SOLN
0.5000 mg | INTRAVENOUS | Status: DC | PRN
  Administered 2021-03-10: 1 mg via INTRAVENOUS
  Filled 2021-03-10: qty 1

## 2021-03-10 MED ORDER — AMISULPRIDE (ANTIEMETIC) 5 MG/2ML IV SOLN: 10.0000 mg | Freq: Once | INTRAVENOUS | Status: DC | PRN

## 2021-03-10 MED ORDER — ATORVASTATIN CALCIUM 10 MG PO TABS
10.0000 mg | ORAL_TABLET | Freq: Every day | ORAL | Status: DC
  Administered 2021-03-10 – 2021-03-12 (×3): 10 mg via ORAL
  Filled 2021-03-10 (×3): qty 1

## 2021-03-10 MED ORDER — CALCIUM CARBONATE-VITAMIN D 500-200 MG-UNIT PO TABS
1.0000 | ORAL_TABLET | Freq: Two times a day (BID) | ORAL | Status: DC
  Administered 2021-03-10 – 2021-03-12 (×5): 1 via ORAL
  Filled 2021-03-10 (×4): qty 1

## 2021-03-10 MED ORDER — GABAPENTIN 400 MG PO CAPS
1600.0000 mg | ORAL_CAPSULE | Freq: Two times a day (BID) | ORAL | Status: DC
  Administered 2021-03-10 – 2021-03-12 (×4): 1600 mg via ORAL
  Filled 2021-03-10 (×4): qty 4

## 2021-03-10 MED ORDER — KETOROLAC TROMETHAMINE 30 MG/ML IJ SOLN
INTRAMUSCULAR | Status: DC | PRN
  Administered 2021-03-10: 30 mg via INTRA_ARTICULAR

## 2021-03-10 MED ORDER — BUPIVACAINE IN DEXTROSE 0.75-8.25 % IT SOLN
INTRATHECAL | Status: DC | PRN
  Administered 2021-03-10: 2 mL via INTRATHECAL

## 2021-03-10 MED ORDER — POVIDONE-IODINE 10 % EX SWAB: 2.0000 "application " | Freq: Once | CUTANEOUS | Status: DC

## 2021-03-10 MED ORDER — PROPOFOL 500 MG/50ML IV EMUL
INTRAVENOUS | Status: DC | PRN
  Administered 2021-03-10: 15 ug/kg/min via INTRAVENOUS

## 2021-03-10 MED ORDER — PHENOL 1.4 % MT LIQD: 1.0000 | OROMUCOSAL | Status: DC | PRN

## 2021-03-10 MED ORDER — SALINE SPRAY 0.65 % NA SOLN: 1.0000 | NASAL | Status: DC | PRN

## 2021-03-10 MED ORDER — FENTANYL CITRATE (PF) 100 MCG/2ML IJ SOLN
INTRAMUSCULAR | Status: AC
  Filled 2021-03-10: qty 2

## 2021-03-10 MED ORDER — ROPIVACAINE HCL 5 MG/ML IJ SOLN
INTRAMUSCULAR | Status: DC | PRN
  Administered 2021-03-10: 20 mL via PERINEURAL

## 2021-03-10 MED ORDER — ASPIRIN 81 MG PO CHEW
81.0000 mg | CHEWABLE_TABLET | Freq: Two times a day (BID) | ORAL | Status: DC
  Administered 2021-03-10 – 2021-03-12 (×4): 81 mg via ORAL
  Filled 2021-03-10 (×4): qty 1

## 2021-03-10 MED ORDER — BENZONATATE 100 MG PO CAPS: 200.0000 mg | ORAL_CAPSULE | Freq: Three times a day (TID) | ORAL | Status: DC | PRN

## 2021-03-10 MED ORDER — LIDOCAINE 2% (20 MG/ML) 5 ML SYRINGE
INTRAMUSCULAR | Status: DC | PRN
  Administered 2021-03-10: 100 mg via INTRAVENOUS

## 2021-03-10 MED ORDER — DEXAMETHASONE SODIUM PHOSPHATE 10 MG/ML IJ SOLN
INTRAMUSCULAR | Status: DC | PRN
  Administered 2021-03-10: 6 mg via INTRAVENOUS

## 2021-03-10 MED ORDER — FAMOTIDINE-CA CARB-MAG HYDROX 10-800-165 MG PO CHEW: 1.0000 | CHEWABLE_TABLET | Freq: Every evening | ORAL | Status: DC | PRN

## 2021-03-10 MED ORDER — METHOCARBAMOL 1000 MG/10ML IJ SOLN
500.0000 mg | Freq: Four times a day (QID) | INTRAVENOUS | Status: DC | PRN
  Filled 2021-03-10: qty 5

## 2021-03-10 MED ORDER — MIDAZOLAM HCL 2 MG/2ML IJ SOLN
INTRAMUSCULAR | Status: AC
  Filled 2021-03-10: qty 2

## 2021-03-10 MED ORDER — IPRATROPIUM BROMIDE 0.03 % NA SOLN: 2.0000 | Freq: Two times a day (BID) | NASAL | Status: DC | PRN

## 2021-03-10 MED ORDER — FENTANYL CITRATE (PF) 100 MCG/2ML IJ SOLN
INTRAMUSCULAR | Status: AC
  Administered 2021-03-10: 25 ug via INTRAVENOUS
  Filled 2021-03-10: qty 2

## 2021-03-10 MED ORDER — MENTHOL 3 MG MT LOZG: 1.0000 | LOZENGE | OROMUCOSAL | Status: DC | PRN

## 2021-03-10 MED ORDER — ALUM & MAG HYDROXIDE-SIMETH 200-200-20 MG/5ML PO SUSP: 30.0000 mL | ORAL | Status: DC | PRN

## 2021-03-10 MED ORDER — DIPHENHYDRAMINE HCL 12.5 MG/5ML PO ELIX
12.5000 mg | ORAL_SOLUTION | ORAL | Status: DC | PRN
  Administered 2021-03-12: 12.5 mg via ORAL
  Filled 2021-03-10: qty 5

## 2021-03-10 MED ORDER — STERILE WATER FOR IRRIGATION IR SOLN
Status: DC | PRN
  Administered 2021-03-10: 2000 mL

## 2021-03-10 MED ORDER — DOCUSATE SODIUM 100 MG PO CAPS
100.0000 mg | ORAL_CAPSULE | Freq: Two times a day (BID) | ORAL | Status: DC
  Administered 2021-03-10 – 2021-03-12 (×5): 100 mg via ORAL
  Filled 2021-03-10 (×5): qty 1

## 2021-03-10 MED ORDER — ATROPINE SULFATE 1 MG/10ML IJ SOSY
PREFILLED_SYRINGE | INTRAMUSCULAR | Status: AC
  Filled 2021-03-10: qty 10

## 2021-03-10 MED ORDER — PROPOFOL 10 MG/ML IV BOLUS
INTRAVENOUS | Status: DC | PRN
  Administered 2021-03-10: 130 mg via INTRAVENOUS

## 2021-03-10 MED ORDER — BUPIVACAINE-EPINEPHRINE (PF) 0.25% -1:200000 IJ SOLN
INTRAMUSCULAR | Status: AC
  Filled 2021-03-10: qty 30

## 2021-03-10 MED ORDER — TRANEXAMIC ACID-NACL 1000-0.7 MG/100ML-% IV SOLN
1000.0000 mg | INTRAVENOUS | Status: AC
  Administered 2021-03-10: 1000 mg via INTRAVENOUS
  Filled 2021-03-10: qty 100

## 2021-03-10 MED ORDER — SODIUM CHLORIDE 0.9 % IV SOLN
2.0000 g | INTRAVENOUS | Status: AC
  Administered 2021-03-10: 2 g via INTRAVENOUS
  Filled 2021-03-10: qty 2

## 2021-03-10 MED ORDER — SENNA 8.6 MG PO TABS
1.0000 | ORAL_TABLET | Freq: Two times a day (BID) | ORAL | Status: DC
  Administered 2021-03-10 – 2021-03-12 (×5): 8.6 mg via ORAL
  Filled 2021-03-10 (×5): qty 1

## 2021-03-10 MED ORDER — ONDANSETRON HCL 4 MG/2ML IJ SOLN: 4.0000 mg | Freq: Four times a day (QID) | INTRAMUSCULAR | Status: DC | PRN

## 2021-03-10 MED ORDER — METOCLOPRAMIDE HCL 5 MG PO TABS
5.0000 mg | ORAL_TABLET | Freq: Three times a day (TID) | ORAL | Status: DC | PRN
Start: 2021-03-10 — End: 2021-03-12

## 2021-03-10 MED ORDER — ONDANSETRON HCL 4 MG/2ML IJ SOLN
INTRAMUSCULAR | Status: AC
  Filled 2021-03-10: qty 6

## 2021-03-10 MED ORDER — EPHEDRINE SULFATE-NACL 50-0.9 MG/10ML-% IV SOSY
PREFILLED_SYRINGE | INTRAVENOUS | Status: DC | PRN
Start: 2021-03-10 — End: 2021-03-10
  Administered 2021-03-10 (×7): 5 mg via INTRAVENOUS

## 2021-03-10 MED ORDER — SODIUM CHLORIDE 0.9 % IR SOLN
Status: DC | PRN
  Administered 2021-03-10: 1000 mL

## 2021-03-10 MED ORDER — ISOPROPYL ALCOHOL 70 % SOLN
Status: DC | PRN
  Administered 2021-03-10: 1 via TOPICAL

## 2021-03-10 MED ORDER — B COMPLEX-C PO TABS
1.0000 | ORAL_TABLET | ORAL | Status: DC
  Administered 2021-03-11: 1 via ORAL
  Filled 2021-03-10: qty 1

## 2021-03-10 MED ORDER — DEXMEDETOMIDINE (PRECEDEX) IN NS 20 MCG/5ML (4 MCG/ML) IV SYRINGE
PREFILLED_SYRINGE | INTRAVENOUS | Status: DC | PRN
  Administered 2021-03-10 (×2): 4 ug via INTRAVENOUS

## 2021-03-10 MED ORDER — HYDROXYCHLOROQUINE SULFATE 200 MG PO TABS
200.0000 mg | ORAL_TABLET | Freq: Two times a day (BID) | ORAL | Status: DC
  Administered 2021-03-10 – 2021-03-12 (×4): 200 mg via ORAL
  Filled 2021-03-10 (×4): qty 1

## 2021-03-10 MED ORDER — SODIUM CHLORIDE 0.9 % IV SOLN
2.0000 g | Freq: Four times a day (QID) | INTRAVENOUS | Status: AC
  Administered 2021-03-10 (×2): 2 g via INTRAVENOUS
  Filled 2021-03-10 (×2): qty 2

## 2021-03-10 MED ORDER — SODIUM CHLORIDE (PF) 0.9 % IJ SOLN
INTRAMUSCULAR | Status: DC | PRN
  Administered 2021-03-10: 30 mL

## 2021-03-10 MED ORDER — KETOROLAC TROMETHAMINE 30 MG/ML IJ SOLN
INTRAMUSCULAR | Status: AC
  Filled 2021-03-10: qty 1

## 2021-03-10 MED ORDER — AMLODIPINE BESYLATE 5 MG PO TABS
2.5000 mg | ORAL_TABLET | Freq: Every day | ORAL | Status: DC
  Administered 2021-03-11 – 2021-03-12 (×2): 2.5 mg via ORAL
  Filled 2021-03-10 (×2): qty 1

## 2021-03-10 MED ORDER — METHOCARBAMOL 500 MG PO TABS
500.0000 mg | ORAL_TABLET | Freq: Four times a day (QID) | ORAL | Status: DC | PRN
  Administered 2021-03-10: 500 mg via ORAL
  Filled 2021-03-10: qty 1

## 2021-03-10 MED ORDER — DEXAMETHASONE SODIUM PHOSPHATE 10 MG/ML IJ SOLN
10.0000 mg | Freq: Once | INTRAMUSCULAR | Status: AC
  Administered 2021-03-11: 10 mg via INTRAVENOUS
  Filled 2021-03-10: qty 1

## 2021-03-10 MED ORDER — TRANEXAMIC ACID-NACL 1000-0.7 MG/100ML-% IV SOLN
1000.0000 mg | Freq: Once | INTRAVENOUS | Status: AC
  Administered 2021-03-10: 1000 mg via INTRAVENOUS
  Filled 2021-03-10: qty 100

## 2021-03-10 MED ORDER — DEXAMETHASONE SODIUM PHOSPHATE 10 MG/ML IJ SOLN
INTRAMUSCULAR | Status: AC
  Filled 2021-03-10: qty 3

## 2021-03-10 MED ORDER — HYDROCODONE-ACETAMINOPHEN 7.5-325 MG PO TABS: 1.0000 | ORAL_TABLET | ORAL | Status: DC | PRN

## 2021-03-10 MED ORDER — ONDANSETRON HCL 4 MG/2ML IJ SOLN
INTRAMUSCULAR | Status: DC | PRN
  Administered 2021-03-10: 4 mg via INTRAVENOUS

## 2021-03-10 MED ORDER — SODIUM CHLORIDE 0.9 % IV SOLN
INTRAVENOUS | Status: DC
Start: 2021-03-10 — End: 2021-03-10

## 2021-03-10 MED ORDER — FUROSEMIDE 20 MG PO TABS: 20.0000 mg | ORAL_TABLET | Freq: Every day | ORAL | Status: DC | PRN

## 2021-03-10 MED ORDER — POLYETHYLENE GLYCOL 3350 17 G PO PACK: 17.0000 g | PACK | Freq: Every day | ORAL | Status: DC | PRN

## 2021-03-10 SURGICAL SUPPLY — 72 items
BAG COUNTER SPONGE SURGICOUNT (BAG) IMPLANT
BAG ZIPLOCK 12X15 (MISCELLANEOUS) IMPLANT
BATTERY INSTRU NAVIGATION (MISCELLANEOUS) ×6 IMPLANT
BLADE SAW RECIPROCATING 77.5 (BLADE) ×2 IMPLANT
BNDG ELASTIC 4X5.8 VLCR STR LF (GAUZE/BANDAGES/DRESSINGS) ×2 IMPLANT
BNDG ELASTIC 6X10 VLCR STRL LF (GAUZE/BANDAGES/DRESSINGS) ×2 IMPLANT
BNDG ELASTIC 6X5.8 VLCR STR LF (GAUZE/BANDAGES/DRESSINGS) ×2 IMPLANT
CHLORAPREP W/TINT 26 (MISCELLANEOUS) ×4 IMPLANT
COMPONENT TRI CR FEM SZ5 KNEE (Orthopedic Implant) ×1 IMPLANT
COVER SURGICAL LIGHT HANDLE (MISCELLANEOUS) ×2 IMPLANT
CUFF TOURN SGL QUICK 34 (TOURNIQUET CUFF) ×1
CUFF TRNQT CYL 34X4.125X (TOURNIQUET CUFF) ×1 IMPLANT
DECANTER SPIKE VIAL GLASS SM (MISCELLANEOUS) ×4 IMPLANT
DERMABOND ADVANCED (GAUZE/BANDAGES/DRESSINGS) ×1
DERMABOND ADVANCED .7 DNX12 (GAUZE/BANDAGES/DRESSINGS) ×1 IMPLANT
DRAPE SHEET LG 3/4 BI-LAMINATE (DRAPES) ×6 IMPLANT
DRAPE U-SHAPE 47X51 STRL (DRAPES) ×2 IMPLANT
DRSG AQUACEL AG ADV 3.5X10 (GAUZE/BANDAGES/DRESSINGS) ×2 IMPLANT
DRSG TEGADERM 4X4.75 (GAUZE/BANDAGES/DRESSINGS) IMPLANT
ELECT BLADE TIP CTD 4 INCH (ELECTRODE) ×2 IMPLANT
ELECT REM PT RETURN 15FT ADLT (MISCELLANEOUS) ×2 IMPLANT
EVACUATOR 1/8 PVC DRAIN (DRAIN) IMPLANT
GAUZE SPONGE 4X4 12PLY STRL (GAUZE/BANDAGES/DRESSINGS) ×2 IMPLANT
GLOVE SRG 8 PF TXTR STRL LF DI (GLOVE) ×2 IMPLANT
GLOVE SURG ENC MOIS LTX SZ8.5 (GLOVE) ×4 IMPLANT
GLOVE SURG ENC TEXT LTX SZ7.5 (GLOVE) ×6 IMPLANT
GLOVE SURG UNDER POLY LF SZ8 (GLOVE) ×2
GLOVE SURG UNDER POLY LF SZ8.5 (GLOVE) ×2 IMPLANT
GOWN SPEC L3 XXLG W/TWL (GOWN DISPOSABLE) ×2 IMPLANT
GOWN SPEC L4 XLG W/TWL (GOWN DISPOSABLE) ×2 IMPLANT
HANDPIECE INTERPULSE COAX TIP (DISPOSABLE) ×1
HOLDER FOLEY CATH W/STRAP (MISCELLANEOUS) ×2 IMPLANT
HOOD PEEL AWAY FLYTE STAYCOOL (MISCELLANEOUS) ×6 IMPLANT
INSERT TIB TRIATH CR X3 (Insert) ×2 IMPLANT
JET LAVAGE IRRISEPT WOUND (IRRIGATION / IRRIGATOR)
KIT TURNOVER KIT A (KITS) ×2 IMPLANT
KNEE PATELLA ASYMMETRIC 10X35 (Knees) ×2 IMPLANT
KNEE TIBIAL COMPONENT SZ5 (Knees) ×2 IMPLANT
LAVAGE JET IRRISEPT WOUND (IRRIGATION / IRRIGATOR) IMPLANT
MARKER SKIN DUAL TIP RULER LAB (MISCELLANEOUS) ×2 IMPLANT
NDL SAFETY ECLIPSE 18X1.5 (NEEDLE) ×1 IMPLANT
NEEDLE HYPO 18GX1.5 SHARP (NEEDLE) ×1
NEEDLE SPNL 18GX3.5 QUINCKE PK (NEEDLE) ×2 IMPLANT
NS IRRIG 1000ML POUR BTL (IV SOLUTION) ×2 IMPLANT
PACK TOTAL KNEE CUSTOM (KITS) ×2 IMPLANT
PADDING CAST COTTON 6X4 STRL (CAST SUPPLIES) ×2 IMPLANT
PENCIL SMOKE EVACUATOR (MISCELLANEOUS) IMPLANT
PIN FLUTED HEDLESS FIX 3.5X1/8 (PIN) ×2 IMPLANT
PROTECTOR NERVE ULNAR (MISCELLANEOUS) ×2 IMPLANT
SAW OSC TIP CART 19.5X105X1.3 (SAW) ×2 IMPLANT
SEALER BIPOLAR AQUA 6.0 (INSTRUMENTS) ×2 IMPLANT
SET HNDPC FAN SPRY TIP SCT (DISPOSABLE) ×1 IMPLANT
SET PAD KNEE POSITIONER (MISCELLANEOUS) ×2 IMPLANT
SPONGE DRAIN TRACH 4X4 STRL 2S (GAUZE/BANDAGES/DRESSINGS) IMPLANT
STAPLER VISISTAT 35W (STAPLE) ×2 IMPLANT
SUT MNCRL AB 3-0 PS2 18 (SUTURE) ×2 IMPLANT
SUT MNCRL AB 4-0 PS2 18 (SUTURE) ×2 IMPLANT
SUT MON AB 2-0 CT1 36 (SUTURE) ×2 IMPLANT
SUT STRATAFIX PDO 1 14 VIOLET (SUTURE) ×1
SUT STRATFX PDO 1 14 VIOLET (SUTURE) ×1
SUT VIC AB 1 CTX 36 (SUTURE) ×2
SUT VIC AB 1 CTX36XBRD ANBCTR (SUTURE) ×2 IMPLANT
SUT VIC AB 2-0 CT1 27 (SUTURE) ×1
SUT VIC AB 2-0 CT1 TAPERPNT 27 (SUTURE) ×1 IMPLANT
SUTURE STRATFX PDO 1 14 VIOLET (SUTURE) ×1 IMPLANT
SYR 3ML LL SCALE MARK (SYRINGE) ×2 IMPLANT
TOWER CARTRIDGE SMART MIX (DISPOSABLE) IMPLANT
TRAY FOLEY MTR SLVR 16FR STAT (SET/KITS/TRAYS/PACK) IMPLANT
TRI CRUC RET FEM SZ5 KNEE (Orthopedic Implant) ×2 IMPLANT
TUBE SUCTION HIGH CAP CLEAR NV (SUCTIONS) ×2 IMPLANT
WATER STERILE IRR 1000ML POUR (IV SOLUTION) ×4 IMPLANT
WRAP KNEE MAXI GEL POST OP (GAUZE/BANDAGES/DRESSINGS) ×2 IMPLANT

## 2021-03-10 NOTE — Anesthesia Procedure Notes (Signed)
Procedure Name: LMA Insertion Date/Time: 03/10/2021 8:10 AM Performed by: Lavina Hamman, CRNA Pre-anesthesia Checklist: Patient identified, Emergency Drugs available, Suction available and Patient being monitored Patient Re-evaluated:Patient Re-evaluated prior to induction Oxygen Delivery Method: Circle System Utilized Preoxygenation: Pre-oxygenation with 100% oxygen Induction Type: IV induction Ventilation: Mask ventilation without difficulty LMA: LMA inserted LMA Size: 4.0 Number of attempts: 1 Airway Equipment and Method: Bite block Placement Confirmation: positive ETCO2 Tube secured with: Tape Dental Injury: Teeth and Oropharynx as per pre-operative assessment

## 2021-03-10 NOTE — Evaluation (Signed)
Physical Therapy Evaluation Patient Details Name: Natalie Page MRN: [id number] DOB: 15-Apr-1946 Today's Date: 03/10/2021   History of Present Illness  Patient is 75 y.o. female s/p Rt TKA on 03/10/21 with PMH significant for OA, HTN, essential tremor, osteopenia, GERD, anxiety, Lt TKA in 2018.    Clinical Impression  Natalie Page is a 75 y.o. female POD 0 s/p Rt TKA. Patient reports independence with mobility at baseline. Patient is now limited by functional impairments (see PT problem list below) and requires min assist for transfers and gait with RW. Patient was able to ambulate ~6 feet with RW and min assist and was limited by nausea, and overheated feeling that subsided with seated rest. Patient instructed in exercise to facilitate ROM and circulation to manage edema and reduce risk of DVT. Patient will benefit from continued skilled PT interventions to address impairments and progress towards PLOF. Acute PT will follow to progress mobility and stair training in preparation for safe discharge home.     Follow Up Recommendations Follow surgeon's recommendation for DC plan and follow-up therapies    Equipment Recommendations  None recommended by PT    Recommendations for Other Services       Precautions / Restrictions Precautions Precautions: Fall Restrictions Weight Bearing Restrictions: No Other Position/Activity Restrictions: WBAT      Mobility  Bed Mobility Overal bed mobility: Needs Assistance Bed Mobility: Supine to Sit     Supine to sit: Min assist;HOB elevated     General bed mobility comments: cues for use of bed rail and assist to bring bil LE off EOB and raise trunk.    Transfers Overall transfer level: Needs assistance Equipment used: Rolling walker (2 wheeled) Transfers: Sit to/from Stand Sit to Stand: Mod assist         General transfer comment: cues for technique/hand placement to rise from EOB to RW. Mod assist for power up from low bed  height.  Ambulation/Gait Ambulation/Gait assistance: Min assist Gait Distance (Feet): 6 Feet Assistive device: Rolling walker (2 wheeled) Gait Pattern/deviations: Step-to pattern Gait velocity: decr   General Gait Details: cues for step to pattern and position of RW, assist to turn walker. pt c/o nausea and overheated feeling and looked flush. seated rest provided and symptoms resolved after <30 seconds.  Stairs            Wheelchair Mobility    Modified Rankin (Stroke Patients Only)       Balance Overall balance assessment: Needs assistance Sitting-balance support: Feet supported Sitting balance-Leahy Scale: Good     Standing balance support: During functional activity;Bilateral upper extremity supported Standing balance-Leahy Scale: Poor                               Pertinent Vitals/Pain Pain Assessment: 0-10 Pain Score: 3  Pain Location: Rt knee Pain Descriptors / Indicators: Aching;Discomfort;Tightness Pain Intervention(s): Limited activity within patient's tolerance;Monitored during session;Repositioned    Home Living Family/patient expects to be discharged to:: Private residence Living Arrangements: Spouse/significant other Available Help at Discharge: Family Type of Home: House Home Access: Stairs to enter Entrance Stairs-Rails: None Entrance Stairs-Number of Steps: 1 Home Layout: One level Home Equipment: Cane - single point;Walker - 2 wheels;Shower seat;Bedside commode      Prior Function Level of Independence: Independent with assistive device(s)         Comments: uses SPC in morning and out of home     Hand Dominance  Dominant Hand: Left    Extremity/Trunk Assessment   Upper Extremity Assessment Upper Extremity Assessment: Overall WFL for tasks assessed    Lower Extremity Assessment Lower Extremity Assessment: RLE deficits/detail RLE Deficits / Details: good quad activation, no extensor lag with SLR RLE Sensation:  WNL RLE Coordination: WNL    Cervical / Trunk Assessment Cervical / Trunk Assessment: Normal  Communication   Communication: No difficulties  Cognition Arousal/Alertness: Awake/alert Behavior During Therapy: WFL for tasks assessed/performed Overall Cognitive Status: Within Functional Limits for tasks assessed                                        General Comments      Exercises Total Joint Exercises Ankle Circles/Pumps: AROM;Both;20 reps;Seated Quad Sets: AROM;Right;10 reps;Seated Heel Slides: AROM;Right;5 reps;Seated   Assessment/Plan    PT Assessment Patient needs continued PT services  PT Problem List Decreased strength;Decreased activity tolerance;Decreased range of motion;Decreased balance;Decreased mobility;Decreased knowledge of use of DME;Decreased knowledge of precautions;Pain       PT Treatment Interventions DME instruction;Gait training;Stair training;Functional mobility training;Therapeutic activities;Therapeutic exercise;Balance training;Neuromuscular re-education;Patient/family education    PT Goals (Current goals can be found in the Care Plan section)  Acute Rehab PT Goals Patient Stated Goal: get walking more PT Goal Formulation: With patient Time For Goal Achievement: 03/17/21 Potential to Achieve Goals: Good    Frequency 7X/week   Barriers to discharge        Co-evaluation               AM-PAC PT "6 Clicks" Mobility  Outcome Measure Help needed turning from your back to your side while in a flat bed without using bedrails?: A Little Help needed moving from lying on your back to sitting on the side of a flat bed without using bedrails?: A Little Help needed moving to and from a bed to a chair (including a wheelchair)?: A Little Help needed standing up from a chair using your arms (e.g., wheelchair or bedside chair)?: A Little Help needed to walk in hospital room?: A Little Help needed climbing 3-5 steps with a railing? : A  Lot 6 Click Score: 17    End of Session Equipment Utilized During Treatment: Gait belt Activity Tolerance: Patient tolerated treatment well Patient left: in chair;with call bell/phone within reach;with chair alarm set Nurse Communication: Mobility status PT Visit Diagnosis: Muscle weakness (generalized) (M62.81);Difficulty in walking, not elsewhere classified (R26.2)    Time: 6314-9702 Time Calculation (min): 21     Charges:   PT Evaluation $PT Eval Low Complexity: 1 Low          Verner Mould, DPT Acute Rehabilitation Services Office [phone] Pager [phone]   Jacques Navy 03/10/2021, 4:59 PM

## 2021-03-10 NOTE — Anesthesia Preprocedure Evaluation (Signed)
Anesthesia Evaluation  Patient identified by MRN, date of birth, ID band Patient awake    Reviewed: Allergy & Precautions, NPO status , Patient's Chart, lab work & pertinent test results  Airway Mallampati: II  TM Distance: >3 FB Neck ROM: Full    Dental  (+) Dental Advisory Given   Pulmonary neg pulmonary ROS,    breath sounds clear to auscultation       Cardiovascular hypertension, Pt. on medications  Rhythm:Regular Rate:Normal     Neuro/Psych  Neuromuscular disease    GI/Hepatic Neg liver ROS, GERD  Medicated,  Endo/Other  negative endocrine ROS  Renal/GU negative Renal ROS     Musculoskeletal  (+) Arthritis ,   Abdominal   Peds  Hematology negative hematology ROS (+)   Anesthesia Other Findings   Reproductive/Obstetrics                             Lab Results  Component Value Date   WBC 5.7 03/08/2021   HGB 13.4 03/08/2021   HCT 41.6 03/08/2021   MCV 89.1 03/08/2021   PLT 220 03/08/2021   Lab Results  Component Value Date   CREATININE 0.52 03/08/2021   BUN 11 03/08/2021   NA 135 03/08/2021   K 3.7 03/08/2021   CL 99 03/08/2021   CO2 28 03/08/2021    Anesthesia Physical Anesthesia Plan  ASA: 2  Anesthesia Plan: Spinal   Post-op Pain Management:  Regional for Post-op pain   Induction:   PONV Risk Score and Plan: 2 and Propofol infusion, Ondansetron and Treatment may vary due to age or medical condition  Airway Management Planned: Natural Airway and Simple Face Mask  Additional Equipment:   Intra-op Plan:   Post-operative Plan:   Informed Consent: I have reviewed the patients History and Physical, chart, labs and discussed the procedure including the risks, benefits and alternatives for the proposed anesthesia with the patient or authorized representative who has indicated his/her understanding and acceptance.       Plan Discussed with: CRNA  Anesthesia  Plan Comments:         Anesthesia Quick Evaluation

## 2021-03-10 NOTE — Op Note (Signed)
OPERATIVE REPORT  SURGEON: Rod Can, MD   ASSISTANT: Cherlynn June, PA-C  PREOPERATIVE DIAGNOSIS: Right knee arthritis.   POSTOPERATIVE DIAGNOSIS: Right knee arthritis.   PROCEDURE: Right total knee arthroplasty.   IMPLANTS: Stryker Triathlon CR femur, size 5. Stryker Tritanium tibia, size 5. X3 polyethelyene insert, size 9 mm, CR. 3 button asymmetric patella, size 35 mm.  ANESTHESIA:  MAC, Regional, and Spinal  TOURNIQUET TIME: Not utilized.   ESTIMATED BLOOD LOSS:-300 mL    ANTIBIOTICS: 2 g Ancef.  DRAINS: None.  COMPLICATIONS: None   CONDITION: PACU - hemodynamically stable.   BRIEF CLINICAL NOTE: Natalie Page is a 75 y.o. female with a long-standing history of Right knee arthritis. After failing conservative management, the patient was indicated for total knee arthroplasty. The risks, benefits, and alternatives to the procedure were explained, and the patient elected to proceed.  PROCEDURE IN DETAIL: Adductor canal block was obtained in the pre-op holding area. Once inside the operative room, spinal anesthesia was obtained, and a foley catheter was inserted. The patient was then positioned, a nonsterile tourniquet was placed, and the lower extremity was prepped and draped in the normal sterile surgical fashion.  A time-out was called verifying side and site of surgery. The patient received IV antibiotics within 60 minutes of beginning the procedure. The tourniquet was not utilized.   An anterior approach to the knee was performed utilizing a midvastus arthrotomy. A medial release was performed and the patellar fat pad was excised. Stryker navigation was used to cut the distal femur perpendicular to the mechanical axis. A freehand patellar resection was performed, and the patella was sized an prepared with 3 lug holes.  Nagivation was used to make a neutral  proximal tibia resection, taking 6 mm of bone from the less affected lateral side with 3 degrees of slope. The menisci were excised. A spacer block was placed, and the alignment and balance in extension were confirmed.   The distal femur was sized using the 3-degree external rotation guide referencing the posterior femoral cortex. The appropriate 4-in-1 cutting block was pinned into place. Rotation was checked using Whiteside's line, the epicondylar axis, and then confirmed with a spacer block in flexion. The remaining femoral cuts were performed, taking care to protect the MCL.  The tibia was sized and the trial tray was pinned into place. The remaining trail components were inserted. The knee was stable to varus and valgus stress through a full range of motion. The patella tracked centrally, and the PCL was well balanced. The trial components were removed, and the proximal tibial surface was prepared. Final components were impacted into place. The knee was tested for a final time and found to be well balanced.   The wound was copiously irrigated with Irrisept solution and normal saline using pule lavage.  Marcaine solution was injected into the periarticular soft tissue.  The wound was closed in layers using #1 Vicryl and Stratafix for the fascia, 2-0 Vicryl for the subcutaneous fat, 2-0 Monocryl for the deep dermal layer, and staples for the skin. Dermabond was applied to the skin.  Once the glue was fully dried, an Aquacell Ag and compressive dressing were applied.  Tthe patient was transported to the recovery room in stable condition.  Sponge, needle, and instrument counts were correct at the end of the case x2.  The patient tolerated the procedure well and there were no known complications.  Please note that a surgical assistant was a medical necessity for this procedure in  order to perform it in a safe and expeditious manner. Surgical assistant was necessary to retract the ligaments and vital  neurovascular structures to prevent injury to them and also necessary for proper positioning of the limb to allow for anatomic placement of the prosthesis.

## 2021-03-10 NOTE — Anesthesia Postprocedure Evaluation (Addendum)
Anesthesia Post Note  Patient: Natalie Page  Procedure(s) Performed: COMPUTER ASSISTED TOTAL KNEE ARTHROPLASTY (Right: Knee)     Patient location during evaluation: PACU Anesthesia Type: General Level of consciousness: awake and alert Pain management: pain level controlled Vital Signs Assessment: post-procedure vital signs reviewed and stable Respiratory status: spontaneous breathing, nonlabored ventilation, respiratory function stable and patient connected to nasal cannula oxygen Cardiovascular status: blood pressure returned to baseline and stable Postop Assessment: no apparent nausea or vomiting Anesthetic complications: no   No notable events documented.  Last Vitals:  Vitals:   03/10/21 1136 03/10/21 1352  BP: (!) 142/66 140/66  Pulse: 72 78  Resp: 16 18  Temp: 36.4 C   SpO2: 98% 99%    Last Pain:  Vitals:   03/10/21 1136  TempSrc: Oral  PainSc: 3                  Tiajuana Amass

## 2021-03-10 NOTE — Anesthesia Procedure Notes (Signed)
Anesthesia Regional Block: Adductor canal block   Pre-Anesthetic Checklist: , timeout performed,  Correct Patient, Correct Site, Correct Laterality,  Correct Procedure, Correct Position, site marked,  Risks and benefits discussed,  Surgical consent,  Pre-op evaluation,  At surgeon's request and post-op pain management  Laterality: Right  Prep: chloraprep       Needles:  Injection technique: Single-shot  Needle Type: Echogenic Needle     Needle Length: 9cm  Needle Gauge: 21     Additional Needles:   Procedures:,,,, ultrasound used (permanent image in chart),,    Narrative:  Start time: 03/10/2021 6:55 AM End time: 03/10/2021 7:01 AM Injection made incrementally with aspirations every 5 mL.  Performed by: Personally  Anesthesiologist: Suzette Battiest, MD

## 2021-03-10 NOTE — Plan of Care (Signed)
Plan of care reviewed and discussed with the patient. 

## 2021-03-10 NOTE — Anesthesia Procedure Notes (Addendum)
Spinal  Patient location during procedure: OR Start time: 03/10/2021 7:50 AM End time: 03/10/2021 8:00 AM Reason for block: surgical anesthesia Staffing Performed: anesthesiologist  Anesthesiologist: Suzette Battiest, MD Preanesthetic Checklist Completed: patient identified, IV checked, site marked, risks and benefits discussed, surgical consent, monitors and equipment checked, pre-op evaluation and timeout performed Spinal Block Patient position: sitting Prep: DuraPrep Patient monitoring: heart rate, cardiac monitor, continuous pulse ox and blood pressure Approach: midline Location: L3-4 Injection technique: single-shot Needle Needle type: Pencan  Needle gauge: 24 G Needle length: 9 cm Assessment Events: failed spinal and CSF return Additional Notes Back prepped and draped in sterile fashion. Easy pass of introducer and spinal needle after local numbing medicine placed subcutaneously. CSF present but very slow flowing and yellow in color. Unable to aspirate CSF and CSF appeared to stick between needle hub and syringe. Additional attempt at a different level yielded the same result. Unable to aspirate. Spinal dose injected with very slow flowing CSF. No appreciable motor or sensory block obtained. Converted to General anesthesia.

## 2021-03-10 NOTE — Transfer of Care (Signed)
Immediate Anesthesia Transfer of Care Note  Patient: Natalie Page  Procedure(s) Performed: Procedure(s): COMPUTER ASSISTED TOTAL KNEE ARTHROPLASTY (Right)  Patient Location: PACU  Anesthesia Type:General  Level of Consciousness:  sedated, patient cooperative and responds to stimulation  Airway & Oxygen Therapy:Patient Spontanous Breathing and Patient connected to face mask oxgen  Post-op Assessment:  Report given to PACU RN and Post -op Vital signs reviewed and stable  Post vital signs:  Reviewed and stable  Last Vitals:  Vitals:   03/10/21 0549  BP: (!) 150/69  Pulse: 71  Resp: 10  Temp: 36.8 C  SpO2: [id number]    Complications: No apparent anesthesia complications

## 2021-03-10 NOTE — Discharge Instructions (Signed)
 Dr. Eriberto Felch Total Joint Specialist [facility] [address] , [place] [address] [phone]  TOTAL KNEE REPLACEMENT POSTOPERATIVE DIRECTIONS    Knee Rehabilitation, Guidelines Following Surgery  Results after knee surgery are often greatly improved when you follow the exercise, range of motion and muscle strengthening exercises prescribed by your doctor. Safety measures are also important to protect the knee from further injury. Any time any of these exercises cause you to have increased pain or swelling in your knee joint, decrease the amount until you are comfortable again and slowly increase them. If you have problems or questions, call your caregiver or physical therapist for advice.   WEIGHT BEARING Weight bearing as tolerated with assist device (walker, cane, etc) as directed, use it as long as suggested by your surgeon or therapist, typically at least 4-6 weeks.  HOME CARE INSTRUCTIONS  Remove items at home which could result in a fall. This includes throw rugs or furniture in walking pathways.  Continue medications as instructed at time of discharge. You may have some home medications which will be placed on hold until you complete the course of blood thinner medication.  You may start showering once you are discharged home but do not submerge the incision under water. Just pat the incision dry and apply a dry gauze dressing on daily. Walk with walker as instructed.  You may resume a sexual relationship in one month or when given the OK by your doctor.  Use walker as long as suggested by your caregivers. Avoid periods of inactivity such as sitting longer than an hour when not asleep. This helps prevent blood clots.  You may put full weight on your legs and walk as much as is comfortable.  You may return to work once you are cleared by your doctor.  Do not drive a car for 6 weeks or until released by you surgeon.  Do not drive while  taking narcotics.  Wear the elastic stockings for three weeks following surgery during the day but you may remove then at night. Make sure you keep all of your appointments after your operation with all of your doctors and caregivers. You should call the office at the above phone number and make an appointment for approximately two weeks after the date of your surgery. Do not remove your surgical dressing. The dressing is waterproof; you may take showers in 3 days, but do not take tub baths or submerge the dressing. Please pick up a stool softener and laxative for home use as long as you are requiring pain medications. ICE to the affected knee every three hours for 30 minutes at a time and then as needed for pain and swelling.  Continue to use ice on the knee for pain and swelling from surgery. You may notice swelling that will progress down to the foot and ankle.  This is normal after surgery.  Elevate the leg when you are not up walking on it.   It is important for you to complete the blood thinner medication as prescribed by your doctor. Continue to use the breathing machine which will help keep your temperature down.  It is common for your temperature to cycle up and down following surgery, especially at night when you are not up moving around and exerting yourself.  The breathing machine keeps your lungs expanded and your temperature down.  RANGE OF MOTION AND STRENGTHENING EXERCISES  Rehabilitation of the knee is important following a knee injury or an   operation. After just a few days of immobilization, the muscles of the thigh which control the knee become weakened and shrink (atrophy). Knee exercises are designed to build up the tone and strength of the thigh muscles and to improve knee motion. Often times heat used for twenty to thirty minutes before working out will loosen up your tissues and help with improving the range of motion but do not use heat for the first two weeks following surgery.  These exercises can be done on a training (exercise) mat, on the floor, on a table or on a bed. Use what ever works the best and is most comfortable for you Knee exercises include:  Leg Lifts - While your knee is still immobilized in a splint or cast, you can do straight leg raises. Lift the leg to 60 degrees, hold for 3 sec, and slowly lower the leg. Repeat 10-20 times 2-3 times daily. Perform this exercise against resistance later as your knee gets better.  Quad and Hamstring Sets - Tighten up the muscle on the front of the thigh (Quad) and hold for 5-10 sec. Repeat this 10-20 times hourly. Hamstring sets are done by pushing the foot backward against an object and holding for 5-10 sec. Repeat as with quad sets.  A rehabilitation program following serious knee injuries can speed recovery and prevent re-injury in the future due to weakened muscles. Contact your doctor or a physical therapist for more information on knee rehabilitation.   POST-OPERATIVE OPIOID TAPER INSTRUCTIONS: It is important to wean off of your opioid medication as soon as possible. If you do not need pain medication after your surgery it is ok to stop day one. Opioids include: Codeine, Hydrocodone(Norco, Vicodin), Oxycodone(Percocet, oxycontin) and hydromorphone amongst others.  Long term and even short term use of opiods can cause: Increased pain response Dependence Constipation Depression Respiratory depression And more.  Withdrawal symptoms can include Flu like symptoms Nausea, vomiting And more Techniques to manage these symptoms Hydrate well Eat regular healthy meals Stay active Use relaxation techniques(deep breathing, meditating, yoga) Do Not substitute Alcohol to help with tapering If you have been on opioids for less than two weeks and do not have pain than it is ok to stop all together.  Plan to wean off of opioids This plan should start within one week post op of your joint replacement. Maintain the same  interval or time between taking each dose and first decrease the dose.  Cut the total daily intake of opioids by one tablet each day Next start to increase the time between doses. The last dose that should be eliminated is the evening dose.    SKILLED REHAB INSTRUCTIONS: If the patient is transferred to a skilled rehab facility following release from the hospital, a list of the current medications will be sent to the facility for the patient to continue.  When discharged from the skilled rehab facility, please have the facility set up the patient's Home Health Physical Therapy prior to being released. Also, the skilled facility will be responsible for providing the patient with their medications at time of release from the facility to include their pain medication, the muscle relaxants, and their blood thinner medication. If the patient is still at the rehab facility at time of the two week follow up appointment, the skilled rehab facility will also need to assist the patient in arranging follow up appointment in our office and any transportation needs.  MAKE SURE YOU:  Understand these instructions.  Will watch   your condition.  Will get help right away if you are not doing well or get worse.    Pick up stool softner and laxative for home use following surgery while on pain medications. Do NOT remove your dressing. You may shower.  Do not take tub baths or submerge incision under water. May shower starting three days after surgery. Please use a clean towel to pat the incision dry following showers. Continue to use ice for pain and swelling after surgery. Do not use any lotions or creams on the incision until instructed by your surgeon.  

## 2021-03-10 NOTE — Interval H&P Note (Signed)
History and Physical Interval Note:  03/10/2021 7:40 AM  Natalie Page  has presented today for surgery, with the diagnosis of right knee osteoarthritis.  The various methods of treatment have been discussed with the patient and family. After consideration of risks, benefits and other options for treatment, the patient has consented to  Procedure(s): COMPUTER ASSISTED TOTAL KNEE ARTHROPLASTY (Right) as a surgical intervention.  The patient's history has been reviewed, patient examined, no change in status, stable for surgery.  I have reviewed the patient's chart and labs.  Questions were answered to the patient's satisfaction.     Hilton Cork Maleigha Colvard

## 2021-03-11 ENCOUNTER — Encounter (HOSPITAL_COMMUNITY): Payer: Self-pay | Admitting: Orthopedic Surgery

## 2021-03-11 DIAGNOSIS — Z20822 Contact with and (suspected) exposure to covid-19: Secondary | ICD-10-CM | POA: Diagnosis present

## 2021-03-11 DIAGNOSIS — Z825 Family history of asthma and other chronic lower respiratory diseases: Secondary | ICD-10-CM | POA: Diagnosis not present

## 2021-03-11 DIAGNOSIS — K219 Gastro-esophageal reflux disease without esophagitis: Secondary | ICD-10-CM | POA: Diagnosis present

## 2021-03-11 DIAGNOSIS — Z96652 Presence of left artificial knee joint: Secondary | ICD-10-CM | POA: Diagnosis present

## 2021-03-11 DIAGNOSIS — I10 Essential (primary) hypertension: Secondary | ICD-10-CM | POA: Diagnosis present

## 2021-03-11 DIAGNOSIS — Z791 Long term (current) use of non-steroidal anti-inflammatories (NSAID): Secondary | ICD-10-CM | POA: Diagnosis not present

## 2021-03-11 DIAGNOSIS — Z823 Family history of stroke: Secondary | ICD-10-CM | POA: Diagnosis not present

## 2021-03-11 DIAGNOSIS — E785 Hyperlipidemia, unspecified: Secondary | ICD-10-CM | POA: Diagnosis present

## 2021-03-11 DIAGNOSIS — Z79899 Other long term (current) drug therapy: Secondary | ICD-10-CM | POA: Diagnosis not present

## 2021-03-11 DIAGNOSIS — M25522 Pain in left elbow: Secondary | ICD-10-CM | POA: Diagnosis present

## 2021-03-11 DIAGNOSIS — M1711 Unilateral primary osteoarthritis, right knee: Secondary | ICD-10-CM | POA: Diagnosis present

## 2021-03-11 DIAGNOSIS — G2581 Restless legs syndrome: Secondary | ICD-10-CM | POA: Diagnosis present

## 2021-03-11 DIAGNOSIS — F419 Anxiety disorder, unspecified: Secondary | ICD-10-CM | POA: Diagnosis present

## 2021-03-11 DIAGNOSIS — Z7982 Long term (current) use of aspirin: Secondary | ICD-10-CM | POA: Diagnosis not present

## 2021-03-11 LAB — BASIC METABOLIC PANEL
Anion gap: 4 — ABNORMAL LOW (ref 5–15)
BUN: 12 mg/dL (ref 8–23)
CO2: 26 mmol/L (ref 22–32)
Calcium: 8.4 mg/dL — ABNORMAL LOW (ref 8.9–10.3)
Chloride: 107 mmol/L (ref 98–111)
Creatinine, Ser: 0.43 mg/dL — ABNORMAL LOW (ref 0.44–1.00)
GFR, Estimated: >60 mL/min (ref >60)
Glucose, Bld: 111 mg/dL — ABNORMAL HIGH (ref 70–99)
Potassium: 3.8 mmol/L (ref 3.5–5.1)
Sodium: 137 mmol/L (ref 135–145)

## 2021-03-11 LAB — CBC
HCT: 28.6 % — ABNORMAL LOW (ref 36.0–46.0)
Hemoglobin: 9.1 g/dL — ABNORMAL LOW (ref 12.0–15.0)
MCH: 29.1 pg (ref 26.0–34.0)
MCHC: 31.8 g/dL (ref 30.0–36.0)
MCV: 91.4 fL (ref 80.0–100.0)
Platelets: 132 10*3/uL — ABNORMAL LOW (ref 150–400)
RBC: 3.13 MIL/uL — ABNORMAL LOW (ref 3.87–5.11)
RDW: 15.1 % (ref 11.5–15.5)
WBC: 5.7 10*3/uL (ref 4.0–10.5)
nRBC: 0 % (ref 0.0–0.2)

## 2021-03-11 MED ORDER — ASPIRIN 81 MG PO CHEW: 81.0000 mg | CHEWABLE_TABLET | Freq: Two times a day (BID) | ORAL | 0 refills | Status: AC

## 2021-03-11 MED ORDER — SENNA 8.6 MG PO TABS: 1.0000 | ORAL_TABLET | Freq: Two times a day (BID) | ORAL | 0 refills | Status: DC

## 2021-03-11 MED ORDER — DOCUSATE SODIUM 100 MG PO CAPS: 100.0000 mg | ORAL_CAPSULE | Freq: Two times a day (BID) | ORAL | 0 refills | Status: AC

## 2021-03-11 MED ORDER — ONDANSETRON HCL 4 MG PO TABS: 4.0000 mg | ORAL_TABLET | Freq: Four times a day (QID) | ORAL | 0 refills | Status: DC | PRN

## 2021-03-11 MED ORDER — ACETAMINOPHEN 325 MG PO TABS: 325.0000 mg | ORAL_TABLET | Freq: Four times a day (QID) | ORAL | Status: DC | PRN

## 2021-03-11 MED ORDER — HYDROCODONE-ACETAMINOPHEN 7.5-325 MG PO TABS: 1.0000 | ORAL_TABLET | ORAL | 0 refills | Status: AC | PRN

## 2021-03-11 NOTE — Progress Notes (Signed)
Physical Therapy Treatment Patient Details Name: Natalie Page MRN: [id number] DOB: 1946/01/28 Today's Date: 03/11/2021    History of Present Illness Patient is 75 y.o. female s/p Rt TKA on 03/10/21 with PMH significant for OA, HTN, essential tremor, osteopenia, GERD, anxiety, Lt TKA in 2018.    PT Comments    Patient making good progress with acute PT and pain improved today to 1/10. Pt required min assist for bed mobility and power up for sit<>stand. Able to progress gait distance to ~60' with min assist intermittently to advance walker appropriate distance. Pt with very slow and cautious gait and no overt LOB observed. EOS completed exercises for ROM and circulation. Acute PT will continue to progress as able for preparation for safe discharge home.    Follow Up Recommendations  Follow surgeon's recommendation for DC plan and follow-up therapies     Equipment Recommendations  None recommended by PT    Recommendations for Other Services       Precautions / Restrictions Precautions Precautions: Fall Restrictions Weight Bearing Restrictions: Yes RLE Weight Bearing: Weight bearing as tolerated Other Position/Activity Restrictions: WBAT    Mobility  Bed Mobility Overal bed mobility: Needs Assistance Bed Mobility: Supine to Sit     Supine to sit: Min assist;HOB elevated     General bed mobility comments: cues for use of bed rail, assist for LE's to EOB. pt scooted anteriorly without assist.    Transfers Overall transfer level: Needs assistance Equipment used: Rolling walker (2 wheeled) Transfers: Sit to/from Stand Sit to Stand: Min assist         General transfer comment: cues for technique/hand placement to rise from EOB to RW. Pt with improved initaition of power up and min assist only to complete rise and steady.  Ambulation/Gait Ambulation/Gait assistance: Min assist Gait Distance (Feet): 60 Feet Assistive device: Rolling walker (2 wheeled) Gait  Pattern/deviations: Step-to pattern Gait velocity: decr   General Gait Details: cues for step to pattern and proximity to RW, pt maintained throughout with no overt LOB. gait very slow and cautious.   Stairs             Wheelchair Mobility    Modified Rankin (Stroke Patients Only)       Balance Overall balance assessment: Needs assistance Sitting-balance support: Feet supported Sitting balance-Leahy Scale: Good     Standing balance support: During functional activity;Bilateral upper extremity supported Standing balance-Leahy Scale: Poor                              Cognition Arousal/Alertness: Awake/alert Behavior During Therapy: WFL for tasks assessed/performed Overall Cognitive Status: Within Functional Limits for tasks assessed                                        Exercises Total Joint Exercises Ankle Circles/Pumps: AROM;Both;20 reps;Seated Quad Sets: AROM;Right;10 reps;Seated Heel Slides: AROM;Right;Seated;10 reps    General Comments        Pertinent Vitals/Pain Pain Assessment: 0-10 Pain Score: 1  Pain Location: Rt knee Pain Descriptors / Indicators: Aching;Discomfort;Tightness Pain Intervention(s): Monitored during session;Limited activity within patient's tolerance;Premedicated before session;Repositioned;Ice applied    Home Living                      Prior Function  PT Goals (current goals can now be found in the care plan section) Acute Rehab PT Goals Patient Stated Goal: get walking more PT Goal Formulation: With patient Time For Goal Achievement: 03/17/21 Potential to Achieve Goals: Good Progress towards PT goals: Progressing toward goals    Frequency    7X/week      PT Plan      Co-evaluation              AM-PAC PT "6 Clicks" Mobility   Outcome Measure  Help needed turning from your back to your side while in a flat bed without using bedrails?: A Little Help needed  moving from lying on your back to sitting on the side of a flat bed without using bedrails?: A Little Help needed moving to and from a bed to a chair (including a wheelchair)?: A Little Help needed standing up from a chair using your arms (e.g., wheelchair or bedside chair)?: A Little Help needed to walk in hospital room?: A Little Help needed climbing 3-5 steps with a railing? : A Lot 6 Click Score: 17    End of Session Equipment Utilized During Treatment: Gait belt Activity Tolerance: Patient tolerated treatment well Patient left: in chair;with call bell/phone within reach;with chair alarm set Nurse Communication: Mobility status PT Visit Diagnosis: Muscle weakness (generalized) (M62.81);Difficulty in walking, not elsewhere classified (R26.2)     Time: [id number] PT Time Calculation (min) (ACUTE ONLY): 25 min  Charges:  $Gait Training: 8-22 mins $Therapeutic Exercise: 8-22 mins                     Verner Mould, DPT Acute Rehabilitation Services Office [phone] Pager [phone]    Jacques Navy 03/11/2021, 1:00 PM

## 2021-03-11 NOTE — Progress Notes (Signed)
Physical Therapy Treatment Patient Details Name: Natalie Page MRN: [id number] DOB: 14-Jul-1946 Today's Date: 03/11/2021    History of Present Illness Patient is 75 y.o. female s/p Rt TKA on 03/10/21 with PMH significant for OA, HTN, essential tremor, osteopenia, GERD, anxiety, Lt TKA in 2018.    PT Comments    Patient seen for follow-up session. Pt ambulated ~40' and with RW and continues to require min assist with cues for posture and step pattern. Pt initiated stair training to complete curb negotiation and required min assist to stabilize walker and balance. Patient very fatigued this afternoon; recommend pt discharge tomorrow after family training for stair mobility to improve safety with return home. Acute PT will continue to progress as able.    Follow Up Recommendations  Follow surgeon's recommendation for DC plan and follow-up therapies     Equipment Recommendations  None recommended by PT    Recommendations for Other Services       Precautions / Restrictions Precautions Precautions: Fall Restrictions Weight Bearing Restrictions: No RLE Weight Bearing: Weight bearing as tolerated Other Position/Activity Restrictions: WBAT    Mobility  Bed Mobility               General bed mobility comments: pt OOB in recliner    Transfers Overall transfer level: Needs assistance Equipment used: Rolling walker (2 wheeled) Transfers: Sit to/from Stand Sit to Stand: Min assist         General transfer comment: cues for safe hand placement to press up from recliner and for use of grab bar in bathroom. Min assist required for power up and to stead due to posterior lean. pt required 2 attemtps from recliner due to posterior LOB.  Ambulation/Gait Ambulation/Gait assistance: Min assist Gait Distance (Feet): 40 Feet Assistive device: Rolling walker (2 wheeled) Gait Pattern/deviations: Step-to pattern Gait velocity: decr   General Gait Details: cue for upright  posture and min assist to advance RW safe distance for gait. pt continues to have slow short steps. no buckling at Rt knee of LOB.   Stairs Stairs: Yes Stairs assistance: Min assist Stair Management: No rails;Step to pattern;Backwards;With walker Number of Stairs: 1 General stair comments: cues for step sequencing for reverse step up with RW "up with good, down with bad". min assist to stabilize RW and steady.   Wheelchair Mobility    Modified Rankin (Stroke Patients Only)       Balance Overall balance assessment: Needs assistance Sitting-balance support: Feet supported Sitting balance-Leahy Scale: Good     Standing balance support: During functional activity;Bilateral upper extremity supported Standing balance-Leahy Scale: Poor                              Cognition Arousal/Alertness: Awake/alert Behavior During Therapy: WFL for tasks assessed/performed Overall Cognitive Status: Within Functional Limits for tasks assessed                                        Exercises Total Joint Exercises Ankle Circles/Pumps: AROM;Both;20 reps;Seated    General Comments        Pertinent Vitals/Pain Pain Assessment: 0-10 Pain Score: 3  Pain Location: Rt knee Pain Descriptors / Indicators: Aching;Discomfort;Tightness Pain Intervention(s): Limited activity within patient's tolerance;Monitored during session;Premedicated before session;Repositioned    Home Living  Prior Function            PT Goals (current goals can now be found in the care plan section) Acute Rehab PT Goals Patient Stated Goal: get walking more PT Goal Formulation: With patient Time For Goal Achievement: 03/17/21 Potential to Achieve Goals: Good Progress towards PT goals: Progressing toward goals    Frequency    7X/week      PT Plan Current plan remains appropriate    Co-evaluation              AM-PAC PT "6 Clicks" Mobility    Outcome Measure  Help needed turning from your back to your side while in a flat bed without using bedrails?: A Little Help needed moving from lying on your back to sitting on the side of a flat bed without using bedrails?: A Little Help needed moving to and from a bed to a chair (including a wheelchair)?: A Little Help needed standing up from a chair using your arms (e.g., wheelchair or bedside chair)?: A Little Help needed to walk in hospital room?: A Little Help needed climbing 3-5 steps with a railing? : A Lot 6 Click Score: 17    End of Session Equipment Utilized During Treatment: Gait belt Activity Tolerance: Patient tolerated treatment well Patient left: in chair;with call bell/phone within reach;with chair alarm set Nurse Communication: Mobility status PT Visit Diagnosis: Muscle weakness (generalized) (M62.81);Difficulty in walking, not elsewhere classified (R26.2)     Time: [id number] PT Time Calculation (min) (ACUTE ONLY): 27 min  Charges:  $Gait Training: 23-37 mins                     Verner Mould, DPT Acute Rehabilitation Services Office [phone] Pager [phone]    Jacques Navy 03/11/2021, 2:48 PM

## 2021-03-11 NOTE — TOC Transition Note (Signed)
Transition of Care (TOC) - CM/SW Discharge Note  Patient Details  Name: Blondina Anne Stouffer MRN: [id number] Date of Birth: 11/17/1945  Transition of Care (TOC) CM/SW Contact:  Megan S Glenn, LCSW Phone Number: 03/11/2021, 9:26 AM  Clinical Narrative: Patient is expected to discharge home after working with PT. CSW met with patient to review discharge needs. Patient will discharge home with OPPT through [facility] in [place]. Patient has a rolling walker, 3N1, and cane at home so there are no DME needs at this time. TOC signing off.  Final next level of care: OP Rehab Barriers to Discharge: No Barriers Identified  Patient Goals and CMS Choice Patient states their goals for this hospitalization and ongoing recovery are:: Discharge home with OPPT through [facility] CMS Medicare.gov Compare Post Acute Care list provided to:: Patient Choice offered to / list presented to : Patient  Discharge Plan and Services         DME Arranged: N/A DME Agency: NA  Readmission Risk Interventions No flowsheet data found.     

## 2021-03-11 NOTE — Progress Notes (Signed)
    Subjective:  Patient reports pain as mild to moderate.  Denies N/V/CP/SOB.   Objective:   VITALS:   Vitals:   03/11/21 0108 03/11/21 0648 03/11/21 0843 03/11/21 1339  BP: (!) 143/64 118/60 132/73 (!) 121/58  Pulse: 72 69 76 81  Resp: '20 17 18 18  '$ Temp: 97.9 F (36.6 C) 98.8 F (37.1 C) 97.6 F (36.4 C) 98.5 F (36.9 C)  TempSrc: Oral Oral Oral Oral  SpO2: 100% 99% 99% 96%  Weight:      Height:        NAD ABD soft Neurovascular intact Sensation intact distally Intact pulses distally Dorsiflexion/Plantar flexion intact Incision: dressing C/D/I   Lab Results  Component Value Date   WBC 5.7 03/11/2021   HGB 9.1 (L) 03/11/2021   HCT 28.6 (L) 03/11/2021   MCV 91.4 03/11/2021   PLT 132 (L) 03/11/2021   BMET    Component Value Date/Time   NA 137 03/11/2021 0319   K 3.8 03/11/2021 0319   CL 107 03/11/2021 0319   CO2 26 03/11/2021 0319   GLUCOSE 111 (H) 03/11/2021 0319   BUN 12 03/11/2021 0319   CREATININE 0.43 (L) 03/11/2021 0319   CALCIUM 8.4 (L) 03/11/2021 0319   GFRNONAA >60 03/11/2021 0319   GFRAA >60 10/26/2017 1104     Assessment/Plan: 1 Day Post-Op   Principal Problem:   Osteoarthritis of right knee   WBAT with walker DVT ppx: Aspirin, SCDs, TEDS PO pain control PT/OT Dispo: D/C home once cleared by therapy     Dorothyann Peng 03/11/2021, 2:05 PM [facility] now [employer] [address], [place],  [address] Phone: [phone] [email or web address] Facebook  [employer]

## 2021-03-11 NOTE — Plan of Care (Signed)

## 2021-03-12 LAB — CBC
HCT: 30.1 % — ABNORMAL LOW (ref 36.0–46.0)
Hemoglobin: 9.7 g/dL — ABNORMAL LOW (ref 12.0–15.0)
MCH: 29.2 pg (ref 26.0–34.0)
MCHC: 32.2 g/dL (ref 30.0–36.0)
MCV: 90.7 fL (ref 80.0–100.0)
Platelets: 165 10*3/uL (ref 150–400)
RBC: 3.32 MIL/uL — ABNORMAL LOW (ref 3.87–5.11)
RDW: 14.8 % (ref 11.5–15.5)
WBC: 7.5 10*3/uL (ref 4.0–10.5)
nRBC: 0 % (ref 0.0–0.2)

## 2021-03-12 NOTE — Plan of Care (Signed)
  Problem: Education: Goal: Knowledge of General Education information will improve Description Including pain rating scale, medication(s)/side effects and non-pharmacologic comfort measures Outcome: Progressing   

## 2021-03-12 NOTE — Progress Notes (Signed)
The patient is alert and oriented and has been seen by her physician. The orders for discharge were written. IV has been removed. Went over discharge instructions with patient and family. She is being discharged via wheelchair with all of her belongings.  

## 2021-03-12 NOTE — Progress Notes (Signed)
Physical Therapy Treatment Patient Details Name: Natalie Page MRN: [id number] DOB: 10-05-1945 Today's Date: 03/12/2021    History of Present Illness Patient is 75 y.o. female s/p Rt TKA on 03/10/21 with PMH significant for OA, HTN, essential tremor, osteopenia, GERD, anxiety, Lt TKA in 2018.    PT Comments    Pt in good spirits and progressing well with mobility. Pt performed HEP and reviewed written instruction.  Pt up to ambulate increased distance in hall and negotiated stairs with assist. Pt states she prefers HHPT vs OP PT secondary to spouse's limited ability to assist her with transport..   Follow Up Recommendations  Follow surgeon's recommendation for DC plan and follow-up therapies     Equipment Recommendations  None recommended by PT    Recommendations for Other Services       Precautions / Restrictions Precautions Precautions: Fall Restrictions Weight Bearing Restrictions: No RLE Weight Bearing: Weight bearing as tolerated    Mobility  Bed Mobility Overal bed mobility: Needs Assistance Bed Mobility: Supine to Sit     Supine to sit: Supervision     General bed mobility comments: Increased time but no physical assist    Transfers Overall transfer level: Needs assistance Equipment used: Rolling walker (2 wheeled) Transfers: Sit to/from Stand Sit to Stand: Min guard;Supervision         General transfer comment: cues for LE management and use of UEs to self assist  Ambulation/Gait Ambulation/Gait assistance: Min guard;Supervision Gait Distance (Feet): 85 Feet Assistive device: Rolling walker (2 wheeled) Gait Pattern/deviations: Step-to pattern Gait velocity: decr   General Gait Details: min cues for sequence and position from RW.   Stairs Stairs: Yes Stairs assistance: Min assist Stair Management: No rails;Step to pattern;Backwards;With walker;Forwards Number of Stairs: 3 General stair comments: single step x 3 - once bkwd and twice fwd;  cues for sequence and foot/RW placement   Wheelchair Mobility    Modified Rankin (Stroke Patients Only)       Balance Overall balance assessment: Needs assistance Sitting-balance support: Feet supported Sitting balance-Leahy Scale: Good     Standing balance support: No upper extremity supported Standing balance-Leahy Scale: Fair                              Cognition Arousal/Alertness: Awake/alert Behavior During Therapy: WFL for tasks assessed/performed Overall Cognitive Status: Within Functional Limits for tasks assessed                                        Exercises Total Joint Exercises Ankle Circles/Pumps: AROM;Both;20 reps;Seated Quad Sets: AROM;Right;10 reps;Seated Heel Slides: Right;AROM;AAROM;20 reps;Supine Hip ABduction/ADduction: AROM;Right;10 reps;Supine Straight Leg Raises: AAROM;AROM;Right;20 reps;Supine Long Arc Quad: AAROM;AROM;Right;10 reps;Seated Knee Flexion: AAROM;Right;10 reps;Seated Goniometric ROM: -5 - 80    General Comments        Pertinent Vitals/Pain Pain Assessment: 0-10 Pain Score: 4  Pain Location: Rt knee Pain Descriptors / Indicators: Aching;Discomfort;Tightness Pain Intervention(s): Limited activity within patient's tolerance;Monitored during session;Premedicated before session;Ice applied    Home Living                      Prior Function            PT Goals (current goals can now be found in the care plan section) Acute Rehab PT Goals Patient Stated Goal: get walking more  PT Goal Formulation: With patient Time For Goal Achievement: 03/17/21 Potential to Achieve Goals: Good Progress towards PT goals: Progressing toward goals    Frequency    7X/week      PT Plan Current plan remains appropriate    Co-evaluation              AM-PAC PT "6 Clicks" Mobility   Outcome Measure  Help needed turning from your back to your side while in a flat bed without using bedrails?:  A Little Help needed moving from lying on your back to sitting on the side of a flat bed without using bedrails?: None Help needed moving to and from a bed to a chair (including a wheelchair)?: A Little Help needed standing up from a chair using your arms (e.g., wheelchair or bedside chair)?: A Little Help needed to walk in hospital room?: A Little Help needed climbing 3-5 steps with a railing? : A Little 6 Click Score: 19    End of Session Equipment Utilized During Treatment: Gait belt Activity Tolerance: Patient tolerated treatment well Patient left: in chair;with call bell/phone within reach;with chair alarm set Nurse Communication: Mobility status PT Visit Diagnosis: Muscle weakness (generalized) (M62.81);Difficulty in walking, not elsewhere classified (R26.2)     Time: [id number] PT Time Calculation (min) (ACUTE ONLY): 54 min  Charges:  $Gait Training: 8-22 mins $Therapeutic Exercise: 8-22 mins $Therapeutic Activity: 8-22 mins                     [place] Pager [phone] Office [phone]    Mehul Rudin 03/12/2021, 10:41 AM

## 2021-03-12 NOTE — Progress Notes (Signed)
Subjective: 2 Days Post-Op Procedure(s) (LRB): COMPUTER ASSISTED TOTAL KNEE ARTHROPLASTY (Right) Seen in rounds for Dr. Lyla Glassing Patient reports pain as mild.   Patient is doing great this am, Very minimal pain in knee She was able to get up with PT yesterday and was able to manage ambulation with walker and minimal pain States she is ready to go home  Objective: Vital signs in last 24 hours: Temp:  [97.6 F (36.4 C)-98.5 F (36.9 C)] 97.9 F (36.6 C) (07/23 0539) Pulse Rate:  [70-81] 70 (07/23 0539) Resp:  [16-18] 16 (07/23 0539) BP: (121-155)/(58-73) 155/71 (07/23 0539) SpO2:  [96 %-100 %] 97 % (07/23 0539) Weight:  [67.6 kg] 67.6 kg (07/22 1950)  Intake/Output from previous day: 07/22 0701 - 07/23 0700 In: 1514.7 [P.O.:1320; I.V.:194.7] Out: 1350 [Urine:1350] Intake/Output this shift: No intake/output data recorded.  Recent Labs    03/11/21 0319 03/12/21 0229  HGB 9.1* 9.7*   Recent Labs    03/11/21 0319 03/12/21 0229  WBC 5.7 7.5  RBC 3.13* 3.32*  HCT 28.6* 30.1*  PLT 132* 165   Recent Labs    03/11/21 0319  NA 137  K 3.8  CL 107  CO2 26  BUN 12  CREATININE 0.43*  GLUCOSE 111*  CALCIUM 8.4*   No results for input(s): LABPT, INR in the last 72 hours.  Neurologically intact Neurovascular intact Sensation intact distally Intact pulses distally Dorsiflexion/Plantar flexion intact Incision: dressing C/D/I No cellulitis present Compartment soft   Assessment/Plan: 2 Days Post-Op Procedure(s) (LRB): COMPUTER ASSISTED TOTAL KNEE ARTHROPLASTY (Right) Patient is going home once cleared by PT Okay to remove ACE prior to d/c and place TED stocking over leg Leave Aquacel on until follow up in the office Will start ASA tomorrow once she gets home Will work with [place] starting next week     Derrick Ravel [phone] 03/12/2021, 7:53 AM

## 2021-03-16 NOTE — Progress Notes (Signed)
This is a late entry note from a visit with [place].  She is having technical problems with Epic and I am entering this note on her behalf as her Contractor.   Natalie Page was sitting up in bedside chair, alert and oriented and upbeat. She spoke of her recent discharge plans that were changed. "I was supposed to go home today but the PT folks told me no, maybe tomorrow ". [name] was at bedside with her and affirmed her sisters desire to come home. Husband at home but unable to come visit her in hospital. Spoke of her faith and how she is eager to be her in her own kitchen, etc. Looking to be discharged this weekend. Support system in [place] and friends and family are present for her. Accepting of decision to wait until safe discharge. No existential distress exhibited.

## 2021-03-21 NOTE — Discharge Summary (Signed)
Physician Discharge Summary  Patient ID: Natalie Page MRN: [id number] DOB/AGE: 77-Dec-1947 77 y.o.  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 ago- 1990's   told that it was normal   Hypertension    Multinodular goiter    Osteoarthritis    Osteopenia    Partial deafness    Peripheral neuropathy    PONV (postoperative nausea and vomiting)    TMJ (dislocation of temporomandibular joint)    Tremor, essential 02/10/2020   Varicose vein of leg    bilateral    Surgeries: Procedure(s): COMPUTER ASSISTED TOTAL KNEE ARTHROPLASTY on 03/10/2021   Consultants (if any):   Discharged Condition: Improved  Hospital Course: Natalie Page is an 75 y.o. female who was admitted 03/10/2021 with a diagnosis of Osteoarthritis of right knee and went to the operating room on 03/10/2021 and underwent the above named procedures.    She was given perioperative antibiotics:  Anti-infectives (From admission, onward)    Start     Dose/Rate Route Frequency Ordered Stop   03/10/21 2200  hydroxychloroquine (PLAQUENIL) tablet 200 mg  Status:  Discontinued        200 mg Oral 2 times daily 03/10/21 1142 03/12/21 1830   03/10/21 1400  ceFAZolin (ANCEF) 2 g in sodium chloride 0.9 % 100 mL IVPB        2 g 200 mL/hr over 30 Minutes Intravenous Every 6 hours 03/10/21 1142 03/10/21 2001   03/10/21 0600  ceFAZolin (ANCEF) 2 g in sodium chloride 0.9 % 100 mL IVPB        2 g 200 mL/hr over 30 Minutes Intravenous On call to O.R. 03/10/21 [id number] 03/10/21 0749     .  She was given  sequential compression devices, early ambulation, and ASA for DVT prophylaxis.  She benefited maximally from the hospital stay and there were no complications.    Recent vital signs:  Vitals:   03/11/21 1950 03/12/21 0539  BP: (!) 150/67 (!) 155/71  Pulse: 77 70  Resp: 16 16  Temp: 98.2 F (36.8 C) 97.9 F (36.6 C)  SpO2: 100% 97%    Recent laboratory studies:  Lab Results  Component Value Date   HGB 9.7 (L) 03/12/2021   HGB 9.1 (L) 03/11/2021   HGB 13.4 03/08/2021   Lab Results  Component Value Date   WBC 7.5 03/12/2021   PLT 165 03/12/2021   Lab Results  Component Value Date   INR 0.9 03/08/2021   Lab Results  Component Value Date   NA 137 03/11/2021   K 3.8 03/11/2021   CL 107 03/11/2021   CO2 26 03/11/2021   BUN 12 03/11/2021   CREATININE 0.43 (L) 03/11/2021   GLUCOSE 111 (H) 03/11/2021     WEIGHT BEARING   Weight bearing as tolerated with assist device (walker, cane, etc) as directed, use it as long as suggested by your surgeon or therapist, typically at least 4-6 weeks.   EXERCISES  Results after joint replacement surgery are often greatly improved when you follow  the exercise, range of motion and muscle strengthening exercises prescribed by your doctor. Safety measures are also important to protect the joint from further injury. Any time any of these exercises cause you to have increased pain or swelling, decrease what you are doing until you are comfortable again and then slowly increase them. If you have problems or questions, call your caregiver or physical therapist for advice.   Rehabilitation is important following a joint replacement. After just a few days of immobilization, the muscles of the leg can become weakened and shrink (atrophy).  These exercises are designed to build up the tone and strength of the thigh and leg muscles and to improve motion. Often times heat used for twenty to thirty minutes before working out will loosen up your tissues  and help with improving the range of motion but do not use heat for the first two weeks following surgery (sometimes heat can increase post-operative swelling).   These exercises can be done on a training (exercise) mat, on the floor, on a table or on a bed. Use whatever works the best and is most comfortable for you.    Use music or television while you are exercising so that the exercises are a pleasant break in your day. This will make your life better with the exercises acting as a break in your routine that you can look forward to.   Perform all exercises about fifteen times, three times per day or as directed.  You should exercise both the operative leg and the other leg as well.  Exercises include:   Quad Sets - Tighten up the muscle on the front of the thigh (Quad) and hold for 5-10 seconds.   Straight Leg Raises - With your knee straight (if you were given a brace, keep it on), lift the leg to 60 degrees, hold for 3 seconds, and slowly lower the leg.  Perform this exercise against resistance later as your leg gets stronger.  Leg Slides: Lying on your back, slowly slide your foot toward your buttocks, bending your knee up off the floor (only go as far as is comfortable). Then slowly slide your foot back down until your leg is flat on the floor again.  Angel Wings: Lying on your back spread your legs to the side as far apart as you can without causing discomfort.  Hamstring Strength:  Lying on your back, push your heel against the floor with your leg straight by tightening up the muscles of your buttocks.  Repeat, but this time bend your knee to a comfortable angle, and push your heel against the floor.  You may put a pillow under the heel to make it more comfortable if necessary.   A rehabilitation program following joint replacement surgery can speed recovery and prevent re-injury in the future due to weakened muscles. Contact your doctor or a physical therapist for more information on knee  rehabilitation.    CONSTIPATION  Constipation is defined medically as fewer than three stools per week and severe constipation as less than one stool per week.  Even if you have a regular bowel pattern at home, your normal regimen is likely to be disrupted due to multiple reasons following surgery.  Combination of anesthesia, postoperative narcotics, change in appetite and fluid intake all can affect your bowels.   YOU MUST use at least one of the following options; they are listed in order of increasing strength to get the job done.  They are all available over the  counter, and you may need to use some, POSSIBLY even all of these options:    Drink plenty of fluids (prune juice may be helpful) and high fiber foods Colace 100 mg by mouth twice a day  Senokot for constipation as directed and as needed Dulcolax (bisacodyl), take with full glass of water  Miralax (polyethylene glycol) once or twice a day as needed.  If you have tried all these things and are unable to have a bowel movement in the first 3-4 days after surgery call either your surgeon or your primary doctor.    If you experience loose stools or diarrhea, hold the medications until you stool forms back up.  If your symptoms do not get better within 1 week or if they get worse, check with your doctor.  If you experience "the worst abdominal pain ever" or develop nausea or vomiting, please contact the office immediately for further recommendations for treatment.   ITCHING:  If you experience itching with your medications, try taking only a single pain pill, or even half a pain pill at a time.  You can also use Benadryl over the counter for itching or also to help with sleep.   TED HOSE STOCKINGS:  Use stockings on both legs until for at least 2 weeks or as directed by physician office. They may be removed at night for sleeping.  MEDICATIONS:  See your medication summary on the "After Visit Summary" that nursing will review with you.   You may have some home medications which will be placed on hold until you complete the course of blood thinner medication.  It is important for you to complete the blood thinner medication as prescribed.  PRECAUTIONS:  If you experience chest pain or shortness of breath - call 911 immediately for transfer to the hospital emergency department.   If you develop a fever greater that 101 F, purulent drainage from wound, increased redness or drainage from wound, foul odor from the wound/dressing, or calf pain - CONTACT YOUR SURGEON.                                                   FOLLOW-UP APPOINTMENTS:  If you do not already have a post-op appointment, please call the office for an appointment to be seen by your surgeon.  Guidelines for how soon to be seen are listed in your "After Visit Summary", but are typically between 1-4 weeks after surgery.  OTHER INSTRUCTIONS:   Knee Replacement:  Do not place pillow under knee, focus on keeping the knee straight while resting. CPM instructions: 0-90 degrees, 2 hours in the morning, 2 hours in the afternoon, and 2 hours in the evening. Place foam block, curve side up under heel at all times except when in CPM or when walking.  DO NOT modify, tear, cut, or change the foam block in any way.   MAKE SURE YOU:  Understand these instructions.  Get help right away if you are not doing well or get worse.    Thank you for letting us be a part of your medical care team.  It is a privilege we respect greatly.  We hope these instructions will help you stay on track for a fast and full recovery!   Diagnostic Studies: DG Knee Right Port  Result Date: 03/10/2021 CLINICAL DATA:  Postop right knee  replacement. EXAM: PORTABLE RIGHT KNEE - 1-2 VIEW COMPARISON:  None. FINDINGS: Two-view portable study shows tricompartmental knee replacement. No evidence for immediate hardware complications. Gas in the joint space no overlying soft tissues is compatible with the immediate  postoperative state. IMPRESSION: Status post right total knee replacement without evidence for immediate hardware complications. Electronically Signed   By: Misty Stanley M.D.   On: 03/10/2021 10:40    Disposition: Discharge disposition: 01-Home or Self Care      Discharge Instructions     Call MD / Call 911   Complete by: As directed    If you experience chest pain or shortness of breath, CALL 911 and be transported to the hospital emergency room.  If you develope a fever above 101 F, pus (white drainage) or increased drainage or redness at the wound, or calf pain, call your surgeon's office.   Call MD / Call 911   Complete by: As directed    If you experience chest pain or shortness of breath, CALL 911 and be transported to the hospital emergency room.  If you develope a fever above 101 F, pus (white drainage) or increased drainage or redness at the wound, or calf pain, call your surgeon's office.   Constipation Prevention   Complete by: As directed    Drink plenty of fluids.  Prune juice may be helpful.  You may use a stool softener, such as Colace (over the counter) 100 mg twice a day.  Use MiraLax (over the counter) for constipation as needed.   Constipation Prevention   Complete by: As directed    Drink plenty of fluids.  Prune juice may be helpful.  You may use a stool softener, such as Colace (over the counter) 100 mg twice a day.  Use MiraLax (over the counter) for constipation as needed.   Diet - low sodium heart healthy   Complete by: As directed    Diet - low sodium heart healthy   Complete by: As directed    Discharge instructions   Complete by: As directed    Dr. Rod Can [facility] [address] [place], [place] [address] [phone]  TOTAL KNEE REPLACEMENT POSTOPERATIVE DIRECTIONS  Knee Rehabilitation, Guidelines Following Surgery  Results after knee surgery are often greatly improved when you follow the exercise, range of motion and muscle  strengthening exercises prescribed by your doctor. Safety measures are also important to protect the knee from further injury. Any time any of these exercises cause you to have increased pain or swelling in your knee joint, decrease the amount until you are comfortable again and slowly increase them. If you have problems or questions, call your caregiver or physical therapist for advice.   HOME CARE INSTRUCTIONS  Remove items at home which could result in a fall. This includes throw rugs or furniture in walking pathways.  ICE to the affected knee every three hours for 30 minutes at a time and then as needed for pain and swelling.  Continue to use ice on the knee for pain and swelling from surgery. You may notice swelling that will progress down to the foot and ankle.  This is normal after surgery.  Elevate the leg when you are not up walking on it.   Continue to use the breathing machine which will help keep your temperature down.  It is common for your temperature to cycle up and down following surgery, especially at night when you are not up moving around and exerting  yourself.  The breathing machine keeps your lungs expanded and your temperature down. Do not place pillow under knee, focus on keeping the knee straight while resting   DIET You may resume your previous home diet once your are discharged from the hospital.  DRESSING / WOUND CARE / SHOWERING Keep the surgical dressing until follow up.  The dressing is water proof, but you need to put extra covering over it like plastic wrap.  IF THE DRESSING FALLS OFF or the wound gets wet inside, change the dressing with sterile gauze.  Please use good hand washing techniques before changing the dressing.  Do not use any lotions or creams on the incision until instructed by your surgeon.   You may start showering once you are discharged home but do not submerge the incision under water.  You are sent home with an ACE bandage on over the leg, this can be  removed at 3 days from surgery. At this time you can start showering. Please place the white TED stocking on the surgical leg after. This needs to be worn on the surgical leg for 2 weeks after surgery.   ACTIVITY Walk with your walker as instructed. Use walker as long as suggested by your caregivers. Avoid periods of inactivity such as sitting longer than an hour when not asleep. This helps prevent blood clots.  You may resume a sexual relationship in one month or when given the OK by your doctor.  You may return to work once you are cleared by your doctor.  Do not drive a car for 6 weeks or until released by you surgeon.  Do not drive while taking narcotics.  WEIGHT BEARING Weight bearing as tolerated with assist device (walker, cane, etc) as directed, use it as long as suggested by your surgeon or therapist, typically at least 4-6 weeks.  POSTOPERATIVE CONSTIPATION PROTOCOL Constipation - defined medically as fewer than three stools per week and severe constipation as less than one stool per week.  One of the most common issues patients have following surgery is constipation.  Even if you have a regular bowel pattern at home, your normal regimen is likely to be disrupted due to multiple reasons following surgery.  Combination of anesthesia, postoperative narcotics, change in appetite and fluid intake all can affect your bowels.  In order to avoid complications following surgery, here are some recommendations in order to help you during your recovery period.  Colace (docusate) - Pick up an over-the-counter form of Colace or another stool softener and take twice a day as long as you are requiring postoperative pain medications.  Take with a full glass of water daily.  If you experience loose stools or diarrhea, hold the colace until you stool forms back up.  If your symptoms do not get better within 1 week or if they get worse, check with your doctor.  Dulcolax (bisacodyl) - Pick up  over-the-counter and take as directed by the product packaging as needed to assist with the movement of your bowels.  Take with a full glass of water.  Use this product as needed if not relieved by Colace only.   MiraLax (polyethylene glycol) - Pick up over-the-counter to have on hand.  MiraLax is a solution that will increase the amount of water in your bowels to assist with bowel movements.  Take as directed and can mix with a glass of water, juice, soda, coffee, or tea.  Take if you go more than two days without a movement.  Do not use MiraLax more than once per day. Call your doctor if you are still constipated or irregular after using this medication for 7 days in a row.  If you continue to have problems with postoperative constipation, please contact the office for further assistance and recommendations.  If you experience "the worst abdominal pain ever" or develop nausea or vomiting, please contact the office immediatly for further recommendations for treatment.  ITCHING  If you experience itching with your medications, try taking only a single pain pill, or even half a pain pill at a time.  You can also use Benadryl over the counter for itching or also to help with sleep.   TED HOSE STOCKINGS Wear the elastic stockings on both legs for two weeks following surgery during the day but you may remove then at night for sleeping.   MEDICATIONS See your medication summary on the "After Visit Summary" that the nursing staff will review with you prior to discharge.  You may have some home medications which will be placed on hold until you complete the course of blood thinner medication.  It is important for you to complete the blood thinner medication as prescribed by your surgeon.  Continue your approved medications as instructed at time of discharge. If you were not previously taking any blood thinners prior to surgery please start taking Aspirin 325 mg tabs twice daily for 6 weeks. If you are unable  to take Aspirin please let your doctor know.   PRECAUTIONS If you experience chest pain or shortness of breath - call 911 immediately for transfer to the hospital emergency department.  If you develop a fever greater that 101 F, purulent drainage from wound, increased redness or drainage from wound, foul odor from the wound/dressing, or calf pain - CONTACT YOUR SURGEON.                                                   FOLLOW-UP APPOINTMENTS Make sure you keep all of your appointments after your operation with your surgeon and caregivers. You should call the office at the above phone number and make an appointment for approximately two weeks after the date of your surgery or on the date instructed by your surgeon outlined in the "After Visit Summary".   RANGE OF MOTION AND STRENGTHENING EXERCISES  Rehabilitation of the knee is important following a knee injury or an operation. After just a few days of immobilization, the muscles of the thigh which control the knee become weakened and shrink (atrophy). Knee exercises are designed to build up the tone and strength of the thigh muscles and to improve knee motion. Often times heat used for twenty to thirty minutes before working out will loosen up your tissues and help with improving the range of motion but do not use heat for the first two weeks following surgery. These exercises can be done on a training (exercise) mat, on the floor, on a table or on a bed. Use what ever works the best and is most comfortable for you Knee exercises include:  Leg Lifts - While your knee is still immobilized in a splint or cast, you can do straight leg raises. Lift the leg to 60 degrees, hold for 3 sec, and slowly lower the leg. Repeat 10-20 times 2-3 times daily. Perform this exercise against resistance later as your  knee gets better.  Quad and Hamstring Sets - Tighten up the muscle on the front of the thigh (Quad) and hold for 5-10 sec. Repeat this 10-20 times hourly.  Hamstring sets are done by pushing the foot backward against an object and holding for 5-10 sec. Repeat as with quad sets.  Leg Slides: Lying on your back, slowly slide your foot toward your buttocks, bending your knee up off the floor (only go as far as is comfortable). Then slowly slide your foot back down until your leg is flat on the floor again. Angel Wings: Lying on your back spread your legs to the side as far apart as you can without causing discomfort.  A rehabilitation program following serious knee injuries can speed recovery and prevent re-injury in the future due to weakened muscles. Contact your doctor or a physical therapist for more information on knee rehabilitation.   IF YOU ARE TRANSFERRED TO A SKILLED REHAB FACILITY If the patient is transferred to a skilled rehab facility following release from the hospital, a list of the current medications will be sent to the facility for the patient to continue.  When discharged from the skilled rehab facility, please have the facility set up the patient's [place] prior to being released. Also, the skilled facility will be responsible for providing the patient with their medications at time of release from the facility to include their pain medication, the muscle relaxants, and their blood thinner medication. If the patient is still at the rehab facility at time of the two week follow up appointment, the skilled rehab facility will also need to assist the patient in arranging follow up appointment in our office and any transportation needs.  MAKE SURE YOU:  Understand these instructions.  Get help right away if you are not doing well or get worse.    Pick up stool softner and laxative for home use following surgery while on pain medications. May shower starting three days after surgery. Please use a clean towel to pat the leg dry following showers. Continue to use ice for pain and swelling after surgery. Do not use any  lotions or creams on the incision until instructed by you Start Aspirin immediately following surgery.   Do not put a pillow under the knee. Place it under the heel.   Complete by: As directed    Do not put a pillow under the knee. Place it under the heel.   Complete by: As directed    Driving restrictions   Complete by: As directed    No driving for 6 weeks   Driving restrictions   Complete by: As directed    No driving for two weeks   Increase activity slowly as tolerated   Complete by: As directed    Lifting restrictions   Complete by: As directed    No lifting for 6 weeks   Post-operative opioid taper instructions:   Complete by: As directed    POST-OPERATIVE OPIOID TAPER INSTRUCTIONS: It is important to wean off of your opioid medication as soon as possible. If you do not need pain medication after your surgery it is ok to stop day one. Opioids include: Codeine, Hydrocodone(Norco, Vicodin), Oxycodone(Percocet, oxycontin) and hydromorphone amongst others.  Long term and even short term use of opiods can cause: Increased pain response Dependence Constipation Depression Respiratory depression And more.  Withdrawal symptoms can include Flu like symptoms Nausea, vomiting And more Techniques to manage these symptoms Hydrate well Eat regular healthy  meals Stay active Use relaxation techniques(deep breathing, meditating, yoga) Do Not substitute Alcohol to help with tapering If you have been on opioids for less than two weeks and do not have pain than it is ok to stop all together.  Plan to wean off of opioids This plan should start within one week post op of your joint replacement. Maintain the same interval or time between taking each dose and first decrease the dose.  Cut the total daily intake of opioids by one tablet each day Next start to increase the time between doses. The last dose that should be eliminated is the evening dose.      Post-operative opioid taper  instructions:   Complete by: As directed    POST-OPERATIVE OPIOID TAPER INSTRUCTIONS: It is important to wean off of your opioid medication as soon as possible. If you do not need pain medication after your surgery it is ok to stop day one. Opioids include: Codeine, Hydrocodone(Norco, Vicodin), Oxycodone(Percocet, oxycontin) and hydromorphone amongst others.  Long term and even short term use of opiods can cause: Increased pain response Dependence Constipation Depression Respiratory depression And more.  Withdrawal symptoms can include Flu like symptoms Nausea, vomiting And more Techniques to manage these symptoms Hydrate well Eat regular healthy meals Stay active Use relaxation techniques(deep breathing, meditating, yoga) Do Not substitute Alcohol to help with tapering If you have been on opioids for less than two weeks and do not have pain than it is ok to stop all together.  Plan to wean off of opioids This plan should start within one week post op of your joint replacement. Maintain the same interval or time between taking each dose and first decrease the dose.  Cut the total daily intake of opioids by one tablet each day Next start to increase the time between doses. The last dose that should be eliminated is the evening dose.      TED hose   Complete by: As directed    Use stockings (TED hose) for 2 weeks on both leg(s).  You may remove them at night for sleeping.   TED hose   Complete by: As directed    Use stockings (TED hose) for three weeks on both leg(s).  You may remove them at night for sleeping.   Weight bearing as tolerated   Complete by: As directed         Follow-up Information     Korbin Notaro, Aaron Edelman, MD. Schedule an appointment as soon as possible for a visit in 2 week(s).   Specialty: Orthopedic Surgery Why: For wound re-check, For suture removal Contact information: [address] [place] [address] [phone]                   Signed: Hilton Cork Calysta Craigo 03/21/2021, 8:53 AM

## 2021-05-31 ENCOUNTER — Other Ambulatory Visit: Payer: Self-pay | Admitting: Specialist

## 2021-05-31 DIAGNOSIS — Z1231 Encounter for screening mammogram for malignant neoplasm of breast: Secondary | ICD-10-CM

## 2021-09-29 ENCOUNTER — Ambulatory Visit
Admission: RE | Admit: 2021-09-29 | Discharge: 2021-09-29 | Disposition: A | Discharge status: Home / Self Care | Payer: Medicare Other | Source: Ambulatory Visit | Attending: Specialist | Admitting: Specialist

## 2021-09-29 DIAGNOSIS — Z1231 Encounter for screening mammogram for malignant neoplasm of breast: Secondary | ICD-10-CM

## 2022-03-20 ENCOUNTER — Telehealth: Payer: Self-pay | Admitting: Gastroenterology

## 2022-03-20 NOTE — Telephone Encounter (Signed)
Good Morning Dr. Ardis Hughs,   Patient called stating that she had a referral in to be seen for Anemia. Patient is requesting to be seen by you. Patient has Gi history with [facility] and was last seen by them in 2021. Patients records are in epic, will you please review and advise on scheduling?  Thank you.

## 2022-03-27 NOTE — Telephone Encounter (Signed)
Request received to transfer GI care from outside practice [facility]) to [facility].  We appreciate the interest in our practice, however at this time due to high demand from patients without established GI providers we cannot accommodate this transfer.  Ability to accommodate future transfer requests may change over time and the patient can contact us again in 6 months if still interested in being seen at [facility].

## 2022-05-09 ENCOUNTER — Ambulatory Visit (INDEPENDENT_AMBULATORY_CARE_PROVIDER_SITE_OTHER): Payer: Medicare Other | Admitting: Internal Medicine

## 2022-05-09 ENCOUNTER — Encounter: Payer: Self-pay | Admitting: Internal Medicine

## 2022-05-09 VITALS — BP 150/77 | HR 78 | Temp 97.3°F | Ht 64.0 in | Wt 144.4 lb

## 2022-05-09 DIAGNOSIS — K219 Gastro-esophageal reflux disease without esophagitis: Secondary | ICD-10-CM | POA: Diagnosis not present

## 2022-05-09 NOTE — Patient Instructions (Signed)
It was good seeing you again today!  As discussed, it is recommended you have another colonoscopy for diagnostic purposes because there was blood in your stool (ASA 2)  Further recommendations to follow after this procedure has been carried out.

## 2022-05-09 NOTE — Progress Notes (Unsigned)
Primary Care Physician:  Pomposini, Cherly Anderson, MD Primary Gastroenterologist:  Dr. Gala Romney  Pre-Procedure History & Physical: HPI:  Natalie Page is a 76 y.o. female here for further evaluation of blood in the stool.  Patient apparently saw PCP recently.  Complaining of fatigue.  Patient returned 3 stool samples all 3 were positive.  Has not had any melena or rectal bleeding or other change in bowel function.  She had a craniotomy earlier this year for removal of benign tumor.  She has recovered nicely.  In care everywhere,  the last CBC I see was from May of this year which revealed a H&H 10.9/33.4 with an MCV of 84.6 platelet count 347.  No iron studies.  This lady has a history of somewhat distant colonic adenomas removed in [place] and in [place].  Last colonoscopy here 4-1/2 years ago diverticulosis and hemorrhoids only.  No further surveillance recommended. Only upper GI tract symptom is occasional GERD after she eats cheese pizza which is rapidly relieved with 2 Pepcid Complete tablets on top of daily PPI. Otherwise, no upper GI tract symptoms such as abdominal pain, nausea, vomiting, odynophagia, dysphagia or early satiety. Appetite is good.  She has not lost any weight.  Past Medical History:  Diagnosis Date   Adenomatous polyp of colon    Anxiety    Cancer ([place])    on back- found to have basal ca   Cervical disc disorder    Diverticular disease    Endometriosis    GERD (gastroesophageal reflux disease)    H/O cardiovascular stress test 20 yrs. ago- 1990's   told that it was normal   Hypertension    Multinodular goiter    Osteoarthritis    Osteopenia    Partial deafness    Peripheral neuropathy    PONV (postoperative nausea and vomiting)    TMJ (dislocation of temporomandibular joint)    Tremor, essential 02/10/2020   Varicose vein of leg    bilateral    Past Surgical History:  Procedure Laterality Date   CATARACT EXTRACTION Bilateral    COLONOSCOPY  2013    Dr. Algis Greenhouse: small cecal polyp and small transverse colon polyp, left colon diverticulosis, tubular adenoma   COLONOSCOPY WITH PROPOFOL N/A 11/01/2017   Procedure: COLONOSCOPY WITH PROPOFOL;  Surgeon: Daneil Dolin, MD;  [facility];  Service: Endoscopy;  Laterality: N/A;  12:30pm   ESOPHAGOGASTRODUODENOSCOPY  03/2012   Dr. Algis Greenhouse: large hiatal hernia, otherwise normal   EYE SURGERY Right    eyelid   HAND SURGERY Bilateral 2001   KNEE ARTHROPLASTY Left 12/18/2016   Procedure: LEFT TOTAL KNEE ARTHROPLASTY WITH COMPUTER NAVIGATION;  Surgeon: Rod Can, MD;  Location: [facility];  Service: Orthopedics;  Laterality: Left;  Dr. request RNFA   KNEE ARTHROPLASTY Right 03/10/2021   Procedure: COMPUTER ASSISTED TOTAL KNEE ARTHROPLASTY;  Surgeon: Rod Can, MD;  Location: [facility];  Service: Orthopedics;  Laterality: Right;   TONSILLECTOMY     TONSILLECTOMY AND ADENOIDECTOMY      Prior to Admission medications   Medication Sig Start Date End Date Taking? Authorizing Provider  acetaminophen (ACETAMINOPHEN 8 HOUR) 650 MG CR tablet Take 650 mg by mouth every 8 (eight) hours as needed for pain.   Yes [provider]  amLODipine (NORVASC) 2.5 MG tablet Take 2.5 mg by mouth daily.   Yes [provider]  aspirin EC 81 MG tablet Take 81 mg by mouth daily. Swallow whole.   Yes [provider]  atorvastatin (LIPITOR) 10 MG tablet Take 10 mg by mouth daily.   Yes [provider]  B Complex Vitamins (B COMPLEX PO) Take 1 capsule by mouth every other day.   Yes [provider]  benzonatate (TESSALON) 200 MG capsule Take 200 mg by mouth 3 (three) times daily as needed for cough.   Yes [provider]  ciclopirox (LOPROX) 0.77 % cream Apply 1 application topically daily as needed for rash. 07/20/20  Yes [provider]  clindamycin (CLEOCIN T) 1 % lotion Apply 1 application topically 2 (two) times daily as needed (acne). 07/19/20  Yes  [provider]  clobetasol (TEMOVATE) 0.05 % external solution Apply 1 application topically daily as needed for itching. 07/19/20  Yes [provider]  docusate sodium (COLACE) 100 MG capsule Take 1 capsule (100 mg total) by mouth 2 (two) times daily. 03/11/21  Yes Dorothyann Peng, PA-C  famotidine-calcium carbonate-magnesium hydroxide (PEPCID COMPLETE) 10-800-165 MG chewable tablet Chew 1 tablet by mouth at bedtime as needed (indigestion).   Yes [provider]  fluticasone (FLONASE) 50 MCG/ACT nasal spray Place 1 spray into both nostrils daily as needed for allergies.   Yes [provider]  folic acid (FOLVITE) 818 MCG tablet Take 800 mcg by mouth daily.   Yes [provider]  furosemide (LASIX) 20 MG tablet Take 20 mg by mouth daily as needed for fluid. 06/03/16  Yes [provider]  gabapentin (NEURONTIN) 800 MG tablet Take 1 tablet (800 mg total) by mouth 3 (three) times daily. Patient taking differently: Take 800 mg by mouth in the morning, at noon, in the evening, and at bedtime. 10/06/16  Yes Marcial Pacas, MD  hydrochlorothiazide (HYDRODIURIL) 25 MG tablet Take 12.5 mg by mouth daily. 06/03/16  Yes [provider]  hydroxychloroquine (PLAQUENIL) 200 MG tablet Take 200 mg by mouth 2 (two) times daily.   Yes [provider]  ipratropium (ATROVENT) 0.03 % nasal spray Place 2 sprays into both nostrils 2 (two) times daily as needed for rhinitis.   Yes [provider]  Melatonin 10 MG CAPS Take 10 mg by mouth at bedtime as needed (sleep).   Yes [provider]  olmesartan (BENICAR) 40 MG tablet Take 40 mg by mouth daily.   Yes [provider]  omeprazole (PRILOSEC) 20 MG capsule Take 20 mg by mouth daily.  03/16/16  Yes [provider]  OYSCO 500 + D 500-200 MG-UNIT TABS Take 1 tablet by mouth 2 (two) times daily. 12/31/20  Yes [provider]  predniSONE (DELTASONE) 5 MG tablet Take  10 mg by mouth daily. 02/28/22  Yes [provider]  sertraline (ZOLOFT) 100 MG tablet Take 100 mg by mouth daily.   Yes [provider]  triamcinolone (KENALOG) 0.1 % Apply 1 application topically 2 (two) times daily as needed for itching. 07/19/20  Yes [provider]    Allergies as of 05/09/2022   (No Known Allergies)    Family History  Problem Relation Age of Onset   Stroke Mother    COPD Father    Cancer Father        Unsure of type - "on his chin"   Throat cancer Sister    Stomach cancer Brother    Breast cancer Sister    Colon cancer Neg Hx     Social History   Socioeconomic History   Marital status: Married    Spouse name: Not on file  Number of children: 0   Years of education: 12   Highest education level: Not on file  Occupational History   Occupation: Retired  Tobacco Use   Smoking status: Never   Smokeless tobacco: Never  Vaping Use   Vaping Use: Never used  Substance and Sexual Activity   Alcohol use: Yes    Comment: 2 glasses three times weekly.   Drug use: No   Sexual activity: Yes    Birth control/protection: Post-menopausal  Other Topics Concern   Not on file  Social History Narrative   Lives at home with husband.   Left-handed.   4-5 cups caffeine daily.   Social Determinants of Health   Financial Resource Strain: Not on file  Food Insecurity: Not on file  Transportation Needs: Not on file  Physical Activity: Not on file  Stress: Not on file  Social Connections: Not on file  Intimate Partner Violence: Not on file    Review of Systems: See HPI, otherwise negative ROS  Physical Exam: BP (!) 150/77 (BP Location: Right Arm, Patient Position: Sitting, Cuff Size: Normal)   Pulse 78   Temp (!) 97.3 F (36.3 C) (Temporal)   Ht '5\' 4"'$  (1.626 m)   Wt 144 lb 6.4 oz (65.5 kg)   SpO2 100%   BMI 24.79 kg/m  General:   Alert,  Well-developed, well-groomed pleasant and cooperative in NAD Neck:  Supple; no masses  or thyromegaly. No significant cervical adenopathy. Lungs:  Clear throughout to auscultation.   No wheezes, crackles, or rhonchi. No acute distress. Heart:  Regular rate and rhythm; no murmurs, clicks, rubs,  or gallops. Abdomen: Non-distended, normal bowel sounds.  Soft and nontender without appreciable mass or hepatosplenomegaly.  Pulses:  Normal pulses noted. Extremities:  Without clubbing or edema.  Impression/Plan: 76 year old lady with occult blood in her stool.  Anemia noted earlier this year.  No overt bleeding.  Distant history of colonic adenomas removed.  Known diverticulosis and hemorrhoids. In this setting, to further evaluate,  the neck step would be a diagnostic colonoscopy.   I discussed this approach along with the risk, benefits limitations and alternatives with the patient.  Her questions been answered she is agreeable.  Recommendations:   As discussed, we will proceed with a diagnostic colonoscopy in the near future.  ASA 2. The risks, benefits, limitations, alternatives and imponderables have been reviewed with the patient. Questions have been answered. All parties are agreeable.   Further recommendations to follow pending colonoscopy results.     Notice: This dictation was prepared with Dragon dictation along with smaller phrase technology. Any transcriptional errors that result from this process are unintentional and may not be corrected upon review.

## 2022-05-19 ENCOUNTER — Telehealth: Payer: Self-pay | Admitting: *Deleted

## 2022-05-19 NOTE — Telephone Encounter (Signed)
Patient left message stating she was in last week and was supposed to be scheduled for a colonoscopy.  Advised pt that Dr.Rourk is booked for the month of October and once we get his schedule for the month of November, we will be giving her a call. She states she can't do Nov. 1st or 10th.

## 2022-05-19 NOTE — Telephone Encounter (Signed)
Pt called back and left message stating it was Nov 2nd not Nov 1st she couldn't do.

## 2022-05-30 ENCOUNTER — Encounter: Payer: Self-pay | Admitting: *Deleted

## 2022-05-30 DIAGNOSIS — K921 Melena: Secondary | ICD-10-CM

## 2022-05-30 MED ORDER — PEG 3350-KCL-NA BICARB-NACL 420 G PO SOLR: 4000.0000 mL | Freq: Once | ORAL | 0 refills | Status: AC

## 2022-07-04 ENCOUNTER — Other Ambulatory Visit: Payer: Self-pay | Admitting: *Deleted

## 2022-07-04 DIAGNOSIS — K921 Melena: Secondary | ICD-10-CM

## 2022-07-04 DIAGNOSIS — K219 Gastro-esophageal reflux disease without esophagitis: Secondary | ICD-10-CM

## 2022-07-11 ENCOUNTER — Other Ambulatory Visit (HOSPITAL_COMMUNITY)
Admission: RE | Admit: 2022-07-11 | Discharge: 2022-07-11 | Disposition: A | Discharge status: Home / Self Care | Payer: Medicare Other | Source: Ambulatory Visit | Attending: Internal Medicine | Admitting: Internal Medicine

## 2022-07-11 DIAGNOSIS — K921 Melena: Secondary | ICD-10-CM | POA: Insufficient documentation

## 2022-07-11 LAB — BASIC METABOLIC PANEL
Anion gap: 5 (ref 5–15)
BUN: 9 mg/dL (ref 8–23)
CO2: 28 mmol/L (ref 22–32)
Calcium: 8.9 mg/dL (ref 8.9–10.3)
Chloride: 97 mmol/L — ABNORMAL LOW (ref 98–111)
Creatinine, Ser: 0.5 mg/dL (ref 0.44–1.00)
GFR, Estimated: >60 mL/min (ref >60)
Glucose, Bld: 97 mg/dL (ref 70–99)
Potassium: 3.1 mmol/L — ABNORMAL LOW (ref 3.5–5.1)
Sodium: 130 mmol/L — ABNORMAL LOW (ref 135–145)

## 2022-07-12 ENCOUNTER — Other Ambulatory Visit: Payer: Self-pay

## 2022-07-12 MED ORDER — POTASSIUM CHLORIDE CRYS ER 20 MEQ PO TBCR: 40.0000 meq | EXTENDED_RELEASE_TABLET | Freq: Every day | ORAL | 0 refills | Status: AC

## 2022-07-12 MED ORDER — POTASSIUM CHLORIDE CRYS ER 20 MEQ PO TBCR: 40.0000 meq | EXTENDED_RELEASE_TABLET | Freq: Every day | ORAL | 0 refills | Status: DC

## 2022-07-14 ENCOUNTER — Other Ambulatory Visit: Payer: Self-pay | Admitting: Internal Medicine

## 2022-07-16 ENCOUNTER — Other Ambulatory Visit: Payer: Self-pay | Admitting: Internal Medicine

## 2022-07-17 ENCOUNTER — Encounter (HOSPITAL_COMMUNITY): Payer: Self-pay | Admitting: Internal Medicine

## 2022-07-17 ENCOUNTER — Encounter (HOSPITAL_COMMUNITY)
Admission: RE | Disposition: A | Discharge status: Home / Self Care | Payer: Self-pay | Source: Home / Self Care | Attending: Internal Medicine

## 2022-07-17 ENCOUNTER — Ambulatory Visit (HOSPITAL_BASED_OUTPATIENT_CLINIC_OR_DEPARTMENT_OTHER): Payer: Medicare Other | Admitting: Anesthesiology

## 2022-07-17 ENCOUNTER — Ambulatory Visit (HOSPITAL_COMMUNITY): Payer: Medicare Other | Admitting: Anesthesiology

## 2022-07-17 ENCOUNTER — Other Ambulatory Visit: Payer: Self-pay

## 2022-07-17 ENCOUNTER — Ambulatory Visit (HOSPITAL_COMMUNITY)
Admission: RE | Admit: 2022-07-17 | Discharge: 2022-07-17 | Disposition: A | Discharge status: Home / Self Care | Payer: Medicare Other | Attending: Internal Medicine | Admitting: Internal Medicine

## 2022-07-17 DIAGNOSIS — K219 Gastro-esophageal reflux disease without esophagitis: Secondary | ICD-10-CM | POA: Insufficient documentation

## 2022-07-17 DIAGNOSIS — K641 Second degree hemorrhoids: Secondary | ICD-10-CM | POA: Diagnosis not present

## 2022-07-17 DIAGNOSIS — G2581 Restless legs syndrome: Secondary | ICD-10-CM | POA: Insufficient documentation

## 2022-07-17 DIAGNOSIS — K635 Polyp of colon: Secondary | ICD-10-CM | POA: Diagnosis not present

## 2022-07-17 DIAGNOSIS — K921 Melena: Secondary | ICD-10-CM | POA: Insufficient documentation

## 2022-07-17 DIAGNOSIS — K5730 Diverticulosis of large intestine without perforation or abscess without bleeding: Secondary | ICD-10-CM | POA: Diagnosis not present

## 2022-07-17 DIAGNOSIS — D122 Benign neoplasm of ascending colon: Secondary | ICD-10-CM

## 2022-07-17 DIAGNOSIS — I10 Essential (primary) hypertension: Secondary | ICD-10-CM | POA: Insufficient documentation

## 2022-07-17 DIAGNOSIS — M1990 Unspecified osteoarthritis, unspecified site: Secondary | ICD-10-CM | POA: Insufficient documentation

## 2022-07-17 DIAGNOSIS — D124 Benign neoplasm of descending colon: Secondary | ICD-10-CM | POA: Diagnosis not present

## 2022-07-17 HISTORY — PX: COLONOSCOPY WITH PROPOFOL: SHX5780

## 2022-07-17 HISTORY — PX: POLYPECTOMY: SHX5525

## 2022-07-17 LAB — CBC
HCT: 36.2 % (ref 36.0–46.0)
Hemoglobin: 11.8 g/dL — ABNORMAL LOW (ref 12.0–15.0)
MCH: 28.7 pg (ref 26.0–34.0)
MCHC: 32.6 g/dL (ref 30.0–36.0)
MCV: 88.1 fL (ref 80.0–100.0)
Platelets: 213 10*3/uL (ref 150–400)
RBC: 4.11 MIL/uL (ref 3.87–5.11)
RDW: 16 % — ABNORMAL HIGH (ref 11.5–15.5)
WBC: 6.1 10*3/uL (ref 4.0–10.5)
nRBC: 0 % (ref 0.0–0.2)

## 2022-07-17 SURGERY — COLONOSCOPY WITH PROPOFOL
Anesthesia: General

## 2022-07-17 MED ORDER — PROPOFOL 10 MG/ML IV BOLUS
INTRAVENOUS | Status: DC | PRN
  Administered 2022-07-17 (×2): 20 mg via INTRAVENOUS

## 2022-07-17 MED ORDER — LACTATED RINGERS IV SOLN: INTRAVENOUS | Status: DC

## 2022-07-17 MED ORDER — PROPOFOL 500 MG/50ML IV EMUL
INTRAVENOUS | Status: DC | PRN
  Administered 2022-07-17: 150 ug/kg/min via INTRAVENOUS

## 2022-07-17 NOTE — H&P (Signed)
$'@LOGO'j$ @   Primary Care Physician:  Pomposini, Cherly Anderson, MD Primary Gastroenterologist:  Dr. Gala Romney  Pre-Procedure History & Physical: HPI:  Natalie Page is a 76 y.o. female here for  further evaluation of occult blood in stool via colonoscopy.  No overt bleeding.  Past Medical History:  Diagnosis Date   Adenomatous polyp of colon    Anxiety    Cancer ([place])    on back- found to have basal ca   Cervical disc disorder    Diverticular disease    Endometriosis    GERD (gastroesophageal reflux disease)    H/O cardiovascular stress test 20 yrs. ago- 1990's   told that it was normal   Hypertension    Multinodular goiter    Osteoarthritis    Osteopenia    Partial deafness    Peripheral neuropathy    PONV (postoperative nausea and vomiting)    TMJ (dislocation of temporomandibular joint)    Tremor, essential 02/10/2020   Varicose vein of leg    bilateral    Past Surgical History:  Procedure Laterality Date   CATARACT EXTRACTION Bilateral    COLONOSCOPY  2013   Dr. Algis Greenhouse: small cecal polyp and small transverse colon polyp, left colon diverticulosis, tubular adenoma   COLONOSCOPY WITH PROPOFOL N/A 11/01/2017   Procedure: COLONOSCOPY WITH PROPOFOL;  Surgeon: Daneil Dolin, MD;  [facility];  Service: Endoscopy;  Laterality: N/A;  12:30pm   ESOPHAGOGASTRODUODENOSCOPY  03/2012   Dr. Algis Greenhouse: large hiatal hernia, otherwise normal   EYE SURGERY Right    eyelid   HAND SURGERY Bilateral 2001   KNEE ARTHROPLASTY Left 12/18/2016   Procedure: LEFT TOTAL KNEE ARTHROPLASTY WITH COMPUTER NAVIGATION;  Surgeon: Rod Can, MD;  Location: [facility];  Service: Orthopedics;  Laterality: Left;  Dr. request RNFA   KNEE ARTHROPLASTY Right 03/10/2021   Procedure: COMPUTER ASSISTED TOTAL KNEE ARTHROPLASTY;  Surgeon: Rod Can, MD;  [facility];  Service: Orthopedics;  Laterality: Right;   TONSILLECTOMY     TONSILLECTOMY AND ADENOIDECTOMY      Prior to Admission  medications   Medication Sig Start Date End Date Taking? Authorizing Provider  acetaminophen (ACETAMINOPHEN 8 HOUR) 650 MG CR tablet Take 650-1,300 mg by mouth every 8 (eight) hours as needed for pain.   Yes [provider]  amLODipine (NORVASC) 2.5 MG tablet Take 2.5 mg by mouth daily.   Yes [provider]  aspirin EC 81 MG tablet Take 81 mg by mouth daily. Swallow whole.   Yes [provider]  atorvastatin (LIPITOR) 10 MG tablet Take 10 mg by mouth daily.   Yes [provider]  B Complex Vitamins (B COMPLEX PO) Take 1 capsule by mouth daily.   Yes [provider]  benzonatate (TESSALON) 200 MG capsule Take 200 mg by mouth 3 (three) times daily as needed for cough.   Yes [provider]  clobetasol (TEMOVATE) 0.05 % external solution Apply 1 application topically daily as needed for itching. 07/19/20  Yes [provider]  docusate sodium (COLACE) 100 MG capsule Take 1 capsule (100 mg total) by mouth 2 (two) times daily. Patient taking differently: Take 200 mg by mouth daily as needed for mild constipation or moderate constipation. 03/11/21  Yes Dorothyann Peng, PA-C  famotidine-calcium carbonate-magnesium hydroxide (PEPCID COMPLETE) 10-800-165 MG chewable tablet Chew 1 tablet by mouth at bedtime as needed (indigestion).   Yes [provider]  fluticasone (FLONASE) 50 MCG/ACT nasal spray Place 1 spray into  both nostrils daily as needed for allergies or rhinitis.   Yes [provider]  folic acid (FOLVITE) 244 MCG tablet Take 800 mcg by mouth daily.   Yes [provider]  furosemide (LASIX) 20 MG tablet Take 20 mg by mouth daily as needed for fluid. 06/03/16  Yes [provider]  gabapentin (NEURONTIN) 800 MG tablet Take 1 tablet (800 mg total) by mouth 3 (three) times daily. Patient taking differently: Take 800 mg by mouth in the morning, at noon, in the evening, and at bedtime. 10/06/16  Yes Marcial Pacas, MD  hydrochlorothiazide (HYDRODIURIL) 25 MG tablet Take 12.5 mg by mouth daily. 06/03/16  Yes [provider]  hydroxychloroquine (PLAQUENIL) 200 MG tablet Take 200 mg by mouth 2 (two) times daily.   Yes [provider]  ipratropium (ATROVENT) 0.03 % nasal spray Place 2 sprays into both nostrils 2 (two) times daily as needed for rhinitis.   Yes [provider]  Melatonin 10 MG CAPS Take 10 mg by mouth at bedtime as needed (sleep).   Yes [provider]  olmesartan (BENICAR) 40 MG tablet Take 40 mg by mouth daily.   Yes [provider]  omeprazole (PRILOSEC) 20 MG capsule Take 20 mg by mouth every morning. 03/16/16  Yes [provider]  OYSCO 500 + D 500-200 MG-UNIT TABS Take 1 tablet by mouth 2 (two) times daily. 12/31/20  Yes [provider]  predniSONE (DELTASONE) 5 MG tablet Take 5 mg by mouth See admin instructions. Take 5 mg twice a day on Monday, Wednesday and Friday 02/28/22  Yes [provider]  sertraline (ZOLOFT) 100 MG tablet Take 50 mg by mouth at bedtime.   Yes [provider]  triamcinolone (KENALOG) 0.1 % Apply 1 application  topically 2 (two) times daily as needed (Irritaion on skin). 07/19/20  Yes [provider]  ciclopirox (LOPROX) 0.77 % cream Apply 1 application topically daily as needed for rash. 07/20/20   [provider]  clindamycin (CLEOCIN T) 1 % lotion Apply 1 application topically 2 (two) times daily as needed (acne). 07/19/20   [provider]  potassium chloride SA (KLOR-CON M) 20 MEQ tablet Take 2 tablets (40 mEq total) by mouth daily. 07/12/22   Daneil Dolin, MD    Allergies as of 05/30/2022   (No Known Allergies)    Family History  Problem Relation Age of Onset   Stroke Mother    COPD Father    Cancer Father        Unsure of type - "on his chin"   Throat cancer Sister    Stomach cancer Brother    Breast cancer Sister    Colon cancer Neg Hx      Social History   Socioeconomic History   Marital status: Married    Spouse name: Not on file   Number of children: 0   Years of education: 12   Highest education level: Not on file  Occupational History   Occupation: Retired  Tobacco Use   Smoking status: Never   Smokeless tobacco: Never  Vaping Use   Vaping Use: Never used  Substance and Sexual Activity   Alcohol use: Yes    Comment: 2 glasses three times weekly.   Drug use: No   Sexual activity: Yes    Birth control/protection: Post-menopausal  Other Topics Concern   Not on file  Social History Narrative   Lives at home with husband.   Left-handed.  4-5 cups caffeine daily.   Social Determinants of Health   Financial Resource Strain: Not on file  Food Insecurity: Not on file  Transportation Needs: Not on file  Physical Activity: Not on file  Stress: Not on file  Social Connections: Not on file  Intimate Partner Violence: Not on file    Review of Systems: See HPI, otherwise negative ROS  Physical Exam: BP (!) 167/79   Pulse 90   Temp 97.8 F (36.6 C) (Oral)   Resp 18   Ht '5\' 4"'$  (1.626 m)   Wt 63.5 kg   SpO2 100%   BMI 24.03 kg/m  General:   Alert,  Well-developed, well-nourished, pleasant and cooperative in NAD Neck:  Supple; no masses or thyromegaly. No significant cervical adenopathy. Lungs:  Clear throughout to auscultation.   No wheezes, crackles, or rhonchi. No acute distress. Heart:  Regular rate and rhythm; no murmurs, clicks, rubs,  or gallops. Abdomen: Non-distended, normal bowel sounds.  Soft and nontender without appreciable mass or hepatosplenomegaly.  Pulses:  Normal pulses noted. Extremities:  Without clubbing or edema.  Impression/Plan:    75 year old lady with occult blood in stool.  Here for diagnostic colonoscopy per plan.  The risks, benefits, limitations, alternatives and imponderables have been reviewed with the patient. Questions have been answered. All parties are  agreeable.       Notice: This dictation was prepared with Dragon dictation along with smaller phrase technology. Any transcriptional errors that result from this process are unintentional and may not be corrected upon review.

## 2022-07-17 NOTE — Anesthesia Postprocedure Evaluation (Signed)
Anesthesia Post Note  Patient: Natalie Page  Procedure(s) Performed: COLONOSCOPY WITH PROPOFOL POLYPECTOMY  Patient location during evaluation: Phase II Anesthesia Type: General Level of consciousness: awake and alert and oriented Pain management: pain level controlled Vital Signs Assessment: post-procedure vital signs reviewed and stable Respiratory status: spontaneous breathing, nonlabored ventilation and respiratory function stable Cardiovascular status: blood pressure returned to baseline and stable Postop Assessment: no apparent nausea or vomiting Anesthetic complications: no  No notable events documented.   Last Vitals:  Vitals:   07/17/22 1044 07/17/22 1053  BP:  97/65  Pulse: 80 78  Resp: 15 18  Temp: 36.5 C   SpO2: 97% 98%    Last Pain:  Vitals:   07/17/22 1053  TempSrc:   PainSc: 0-No pain                 [name] C Bhakti Labella

## 2022-07-17 NOTE — Discharge Instructions (Signed)
  Colonoscopy Discharge Instructions  Read the instructions outlined below and refer to this sheet in the next few weeks. These discharge instructions provide you with general information on caring for yourself after you leave the hospital. Your doctor may also give you specific instructions. While your treatment has been planned according to the most current medical practices available, unavoidable complications occasionally occur. If you have any problems or questions after discharge, call Dr. Gala Romney at [phone]. ACTIVITY You may resume your regular activity, but move at a slower pace for the next 24 hours.  Take frequent rest periods for the next 24 hours.  Walking will help get rid of the air and reduce the bloated feeling in your belly (abdomen).  No driving for 24 hours (because of the medicine (anesthesia) used during the test).   Do not sign any important legal documents or operate any machinery for 24 hours (because of the anesthesia used during the test).  NUTRITION Drink plenty of fluids.  You may resume your normal diet as instructed by your doctor.  Begin with a light meal and progress to your normal diet. Heavy or fried foods are harder to digest and may make you feel sick to your stomach (nauseated).  Avoid alcoholic beverages for 24 hours or as instructed.  MEDICATIONS You may resume your normal medications unless your doctor tells you otherwise.  WHAT YOU CAN EXPECT TODAY Some feelings of bloating in the abdomen.  Passage of more gas than usual.  Spotting of blood in your stool or on the toilet paper.  IF YOU HAD POLYPS REMOVED DURING THE COLONOSCOPY: No aspirin products for 7 days or as instructed.  No alcohol for 7 days or as instructed.  Eat a soft diet for the next 24 hours.  FINDING OUT THE RESULTS OF YOUR TEST Not all test results are available during your visit. If your test results are not back during the visit, make an appointment with your caregiver to find out the  results. Do not assume everything is normal if you have not heard from your caregiver or the medical facility. It is important for you to follow up on all of your test results.  SEEK IMMEDIATE MEDICAL ATTENTION IF: You have more than a spotting of blood in your stool.  Your belly is swollen (abdominal distention).  You are nauseated or vomiting.  You have a temperature over 101.  You have abdominal pain or discomfort that is severe or gets worse throughout the day.      6 polyps removed from your colon  Hemorrhoid, diverticulosis and colon polyp information provided   unable to reconcile your medication list because the gabapentin dose is uncertain.  Please contact your prescriber   to make sure you are on the correct dose   further recommendations to follow pending review of pathology report   CBC today  At patient request, I called Mickey Farber at [phone] -  rolled to voicemail.-Left a message.

## 2022-07-17 NOTE — Anesthesia Preprocedure Evaluation (Signed)
Anesthesia Evaluation  Patient identified by MRN, date of birth, ID band Patient awake    Reviewed: Allergy & Precautions, H&P , NPO status , Patient's Chart, lab work & pertinent test results  History of Anesthesia Complications (+) PONV and history of anesthetic complications  Airway Mallampati: II  TM Distance: >3 FB Neck ROM: Full   Comment: TMJ (dislocation of temporomandibular joint) Dental  (+) Dental Advisory Given Root canal:   Pulmonary neg pulmonary ROS   Pulmonary exam normal breath sounds clear to auscultation       Cardiovascular Exercise Tolerance: Good METS (uses cane): hypertension, Pt. on medications Normal cardiovascular exam Rhythm:Regular Rate:Normal     Neuro/Psych  PSYCHIATRIC DISORDERS Anxiety      Neuromuscular disease (RLS)    GI/Hepatic Neg liver ROS,GERD  Medicated and Controlled,,  Endo/Other  negative endocrine ROS    Renal/GU negative Renal ROS  negative genitourinary   Musculoskeletal  (+) Arthritis , Osteoarthritis,    Abdominal   Peds negative pediatric ROS (+)  Hematology negative hematology ROS (+)   Anesthesia Other Findings   Reproductive/Obstetrics negative OB ROS                             Anesthesia Physical Anesthesia Plan  ASA: 2  Anesthesia Plan: General   Post-op Pain Management: Minimal or no pain anticipated   Induction: Intravenous  PONV Risk Score and Plan: Propofol infusion  Airway Management Planned: Nasal Cannula and Natural Airway  Additional Equipment:   Intra-op Plan:   Post-operative Plan:   Informed Consent: I have reviewed the patients History and Physical, chart, labs and discussed the procedure including the risks, benefits and alternatives for the proposed anesthesia with the patient or authorized representative who has indicated his/her understanding and acceptance.     Dental advisory given  Plan  Discussed with: CRNA and Surgeon  Anesthesia Plan Comments:        Anesthesia Quick Evaluation

## 2022-07-17 NOTE — Transfer of Care (Signed)
Immediate Anesthesia Transfer of Care Note  Patient: Natalie Page  Procedure(s) Performed: COLONOSCOPY WITH PROPOFOL POLYPECTOMY  Patient Location: Short Stay  Anesthesia Type:General  Level of Consciousness: awake, alert , oriented, and patient cooperative  Airway & Oxygen Therapy: Patient Spontanous Breathing  Post-op Assessment: Report given to RN, Post -op Vital signs reviewed and stable, and Patient moving all extremities X 4  Post vital signs: Reviewed and stable  Last Vitals:  Vitals Value Taken Time  BP    Temp 36.5 C 07/17/22 1044  Pulse 80 07/17/22 1044  Resp 15 07/17/22 1044  SpO2 97 % 07/17/22 1044    Last Pain:  Vitals:   07/17/22 1044  TempSrc: Oral  PainSc: 0-No pain      Patients Stated Pain Goal: 6 (86/57/84 6962)  Complications: No notable events documented.

## 2022-07-17 NOTE — Op Note (Signed)
[facility] Patient Name: Natalie Page Procedure Date: 07/17/2022 9:52 AM MRN: [id number] Date of Birth: April 02, 1946 Attending MD: Norvel Richards , MD, [id number] CSN: [id number] Age: 76 Admit Type: Outpatient Procedure:                Colonoscopy Indications:              Gastrointestinal occult blood loss Providers:                Norvel Richards, MD, Rosina Lowenstein, RN,                            Everardo Pacific Referring MD:              Medicines:                Propofol per Anesthesia Complications:            No immediate complications. Estimated Blood Loss:     Estimated blood loss was minimal. Procedure:                Pre-Anesthesia Assessment:                           - Prior to the procedure, a History and Physical                            was performed, and patient medications and                            allergies were reviewed. The patient's tolerance of                            previous anesthesia was also reviewed. The risks                            and benefits of the procedure and the sedation                            options and risks were discussed with the patient.                            All questions were answered, and informed consent                            was obtained. Prior Anticoagulants: The patient has                            taken no anticoagulant or antiplatelet agents. ASA                            Grade Assessment: II - A patient with mild systemic                            disease. After reviewing the risks and benefits,  the patient was deemed in satisfactory condition to                            undergo the procedure.                           After obtaining informed consent, the colonoscope                            was passed under direct vision. Throughout the                            procedure, the patient's blood pressure, pulse, and                            oxygen  saturations were monitored continuously. The                            [id number]) scope was introduced through the                            anus and advanced to the the cecum, identified by                            appendiceal orifice and ileocecal valve. The                            colonoscopy was performed without difficulty. The                            patient tolerated the procedure well. The quality                            of the bowel preparation was adequate. The entire                            colon was well visualized. The ileocecal valve,                            appendiceal orifice, and rectum were photographed. Scope In: 10:20:46 AM Scope Out: 10:40:28 AM Scope Withdrawal Time: 0 hours 11 minutes 32 seconds  Total Procedure Duration: 0 hours 19 minutes 42 seconds  Findings:      The perianal and digital rectal examinations were normal.      Non-bleeding internal hemorrhoids were found during retroflexion. The       hemorrhoids were moderate, medium-sized and Grade II (internal       hemorrhoids that prolapse but reduce spontaneously).      Scattered medium-mouthed diverticula were found in the entire colon.      Six sessile polyps were found in the descending colon, ascending colon       and cecum. The polyps were 5 to 9 mm in size. These polyps were removed       with a cold snare. Resection and retrieval were complete. Estimated       blood loss was  minimal. Estimated blood loss was minimal.      The exam was otherwise without abnormality on direct and retroflexion       views. Impression:               - Non-bleeding internal hemorrhoids.                           - Diverticulosis in the entire examined colon.                           - Six 5 to 9 mm polyps in the descending colon, in                            the ascending colon and in the cecum, removed with                            a cold snare. Resected and retrieved.                            - The examination was otherwise normal on direct                            and retroflexion views. Moderate Sedation:      Moderate (conscious) sedation was personally administered by an       anesthesia professional. The following parameters were monitored: oxygen       saturation, heart rate, blood pressure, respiratory rate, EKG, adequacy       of pulmonary ventilation, and response to care. Recommendation:           - Patient has a contact number available for                            emergencies. The signs and symptoms of potential                            delayed complications were discussed with the                            patient. Return to normal activities tomorrow.                            Written discharge instructions were provided to the                            patient.                           - Advance diet as tolerated.                           - Continue present medications.                           - Repeat colonoscopy date to be determined after  pending pathology results are reviewed for                            surveillance.                           - Return to GI office in 3 months. Procedure Code(s):        --- Professional ---                           [phone], Colonoscopy, flexible; with removal of                            tumor(s), polyp(s), or other lesion(s) by snare                            technique Diagnosis Code(s):        --- Professional ---                           K64.1, Second degree hemorrhoids                           D12.4, Benign neoplasm of descending colon                           D12.2, Benign neoplasm of ascending colon                           D12.0, Benign neoplasm of cecum                           R19.5, Other fecal abnormalities                           K57.30, Diverticulosis of large intestine without                            perforation or abscess without bleeding CPT  copyright 2022 [employer]. All rights reserved. The codes documented in this report are preliminary and upon coder review may  be revised to meet current compliance requirements. Cristopher Estimable. Dunia Pringle, MD Norvel Richards, MD 07/17/2022 10:49:27 AM This report has been signed electronically. Number of Addenda: 0

## 2022-07-18 ENCOUNTER — Encounter: Payer: Self-pay | Admitting: Internal Medicine

## 2022-07-18 LAB — SURGICAL PATHOLOGY

## 2022-07-21 ENCOUNTER — Encounter (HOSPITAL_COMMUNITY): Payer: Self-pay | Admitting: Internal Medicine

## 2022-09-18 ENCOUNTER — Other Ambulatory Visit: Payer: Self-pay | Admitting: Specialist

## 2022-09-18 DIAGNOSIS — Z1231 Encounter for screening mammogram for malignant neoplasm of breast: Secondary | ICD-10-CM

## 2022-11-06 ENCOUNTER — Ambulatory Visit: Payer: Medicare Other

## 2022-12-04 IMAGING — MG MM DIGITAL SCREENING BILAT W/ TOMO AND CAD
8 series · 8 of 24 positions shown · non-contrast
Comparison: Previous exam(s).

CLINICAL DATA: Screening.

EXAM:
DIGITAL SCREENING BILATERAL MAMMOGRAM WITH TOMOSYNTHESIS AND CAD
TECHNIQUE: Bilateral screening digital craniocaudal and mediolateral oblique
mammograms were obtained. Bilateral screening digital breast
tomosynthesis was performed. The images were evaluated with
computer-aided detection.

[L MLO synth-2D]
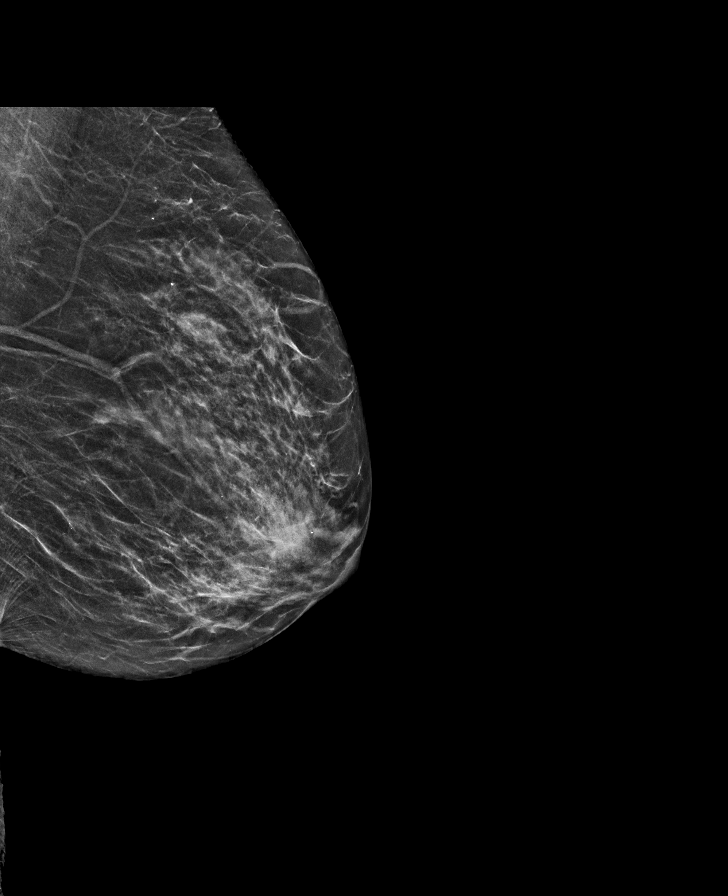

[L CC synth-2D]
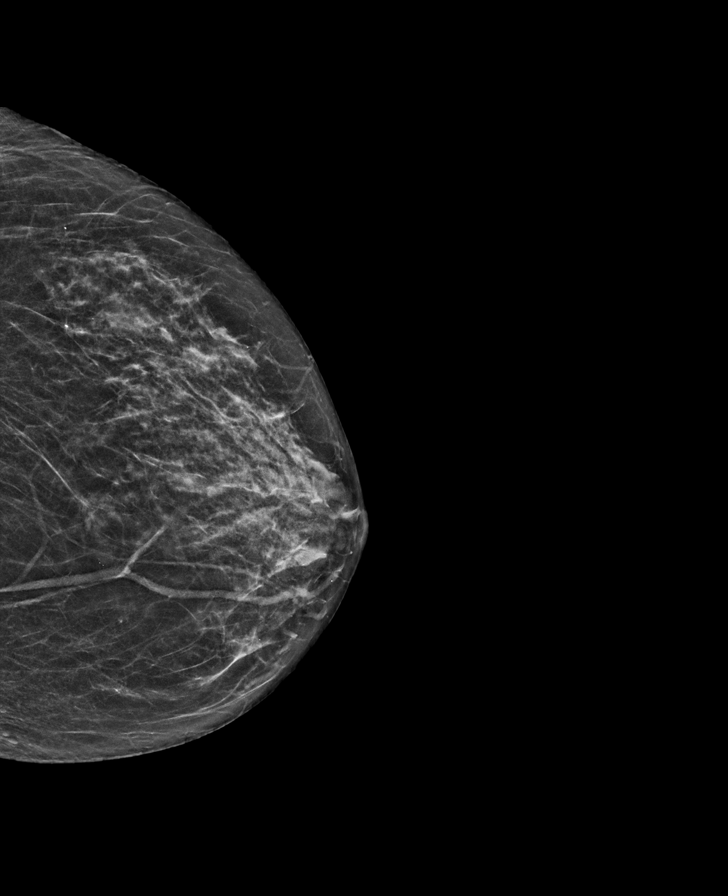

[R MLO synth-2D]
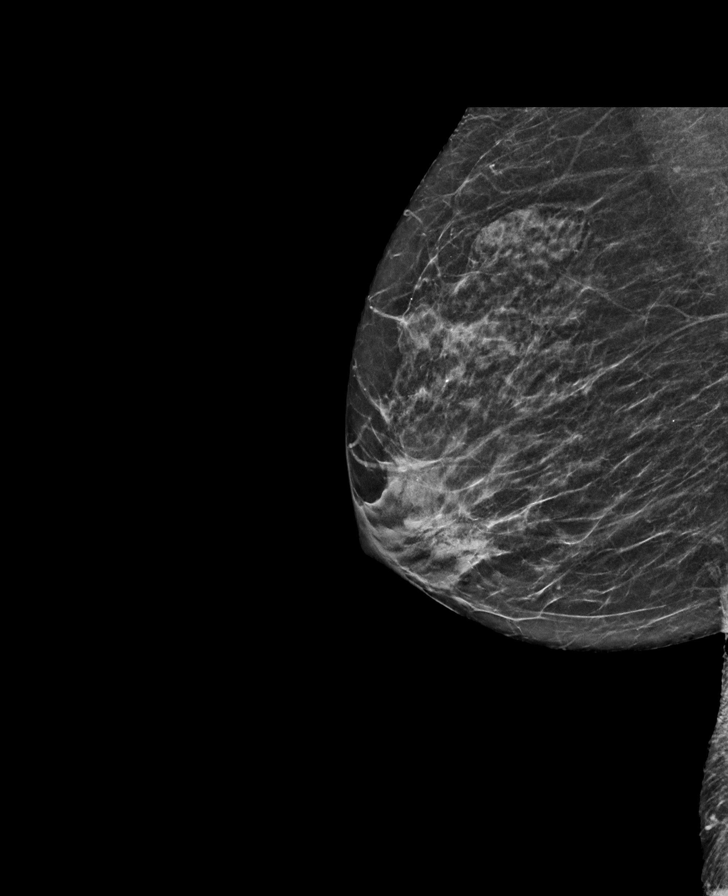

[R CC synth-2D]
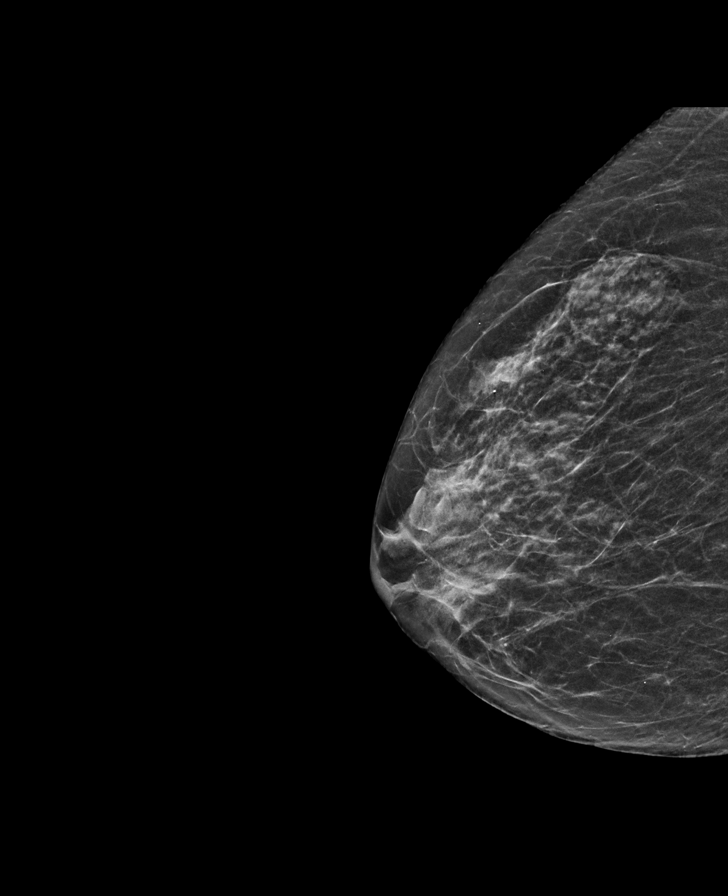

[R CC tomo · tomo slice 26/51.0]
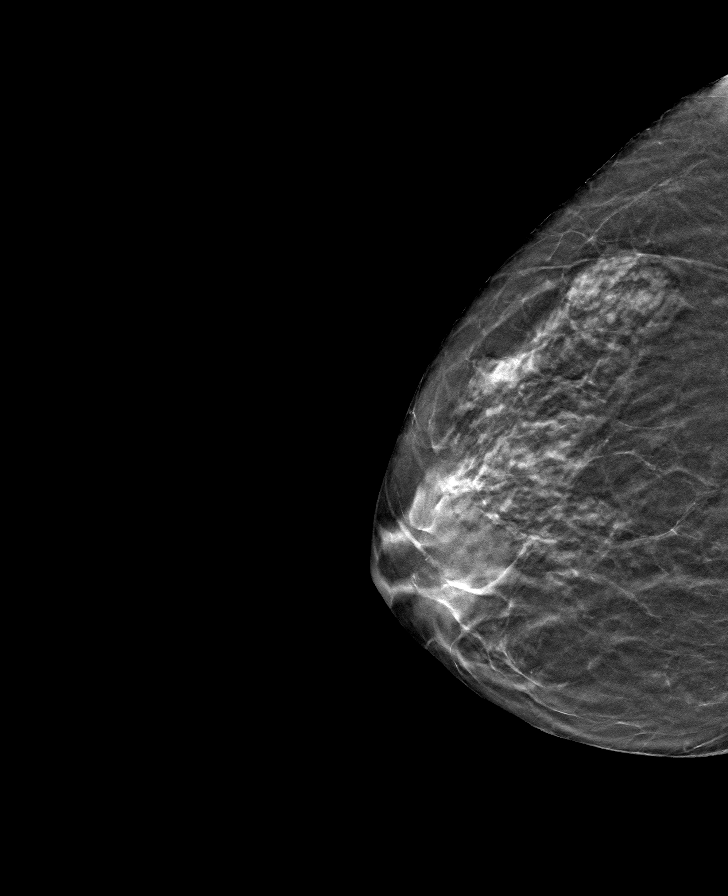

[L MLO tomo · tomo slice 29/56.0]
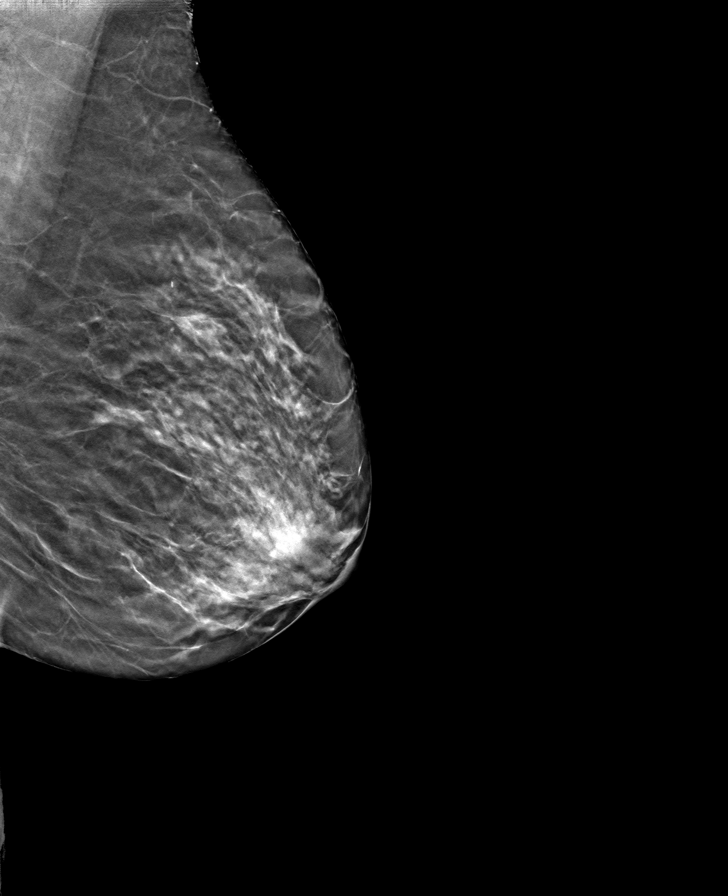

[R MLO tomo · tomo slice 25/49.0]
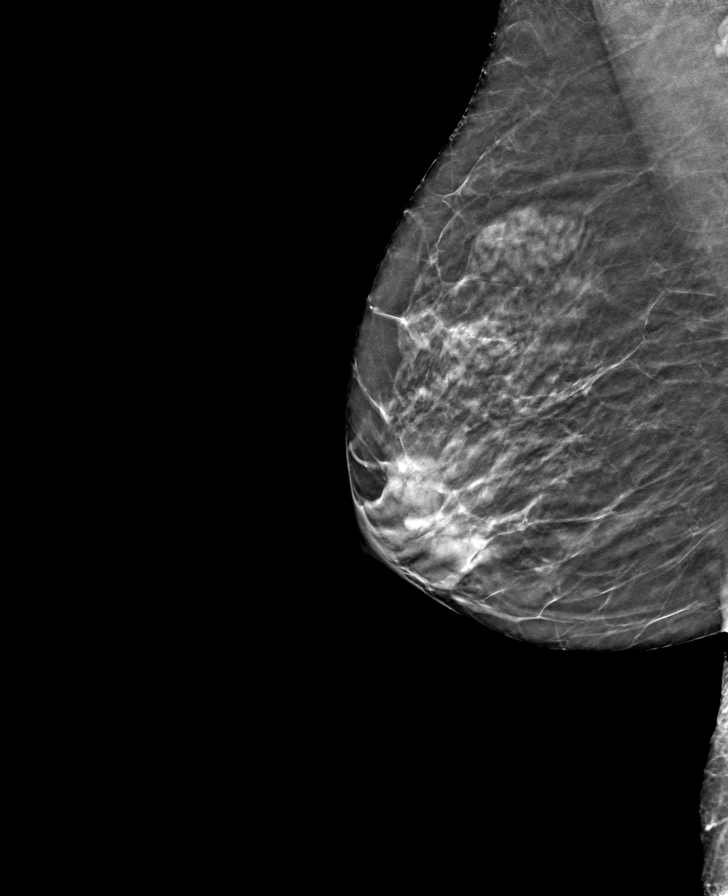

[L CC tomo · tomo slice 27/52.0]
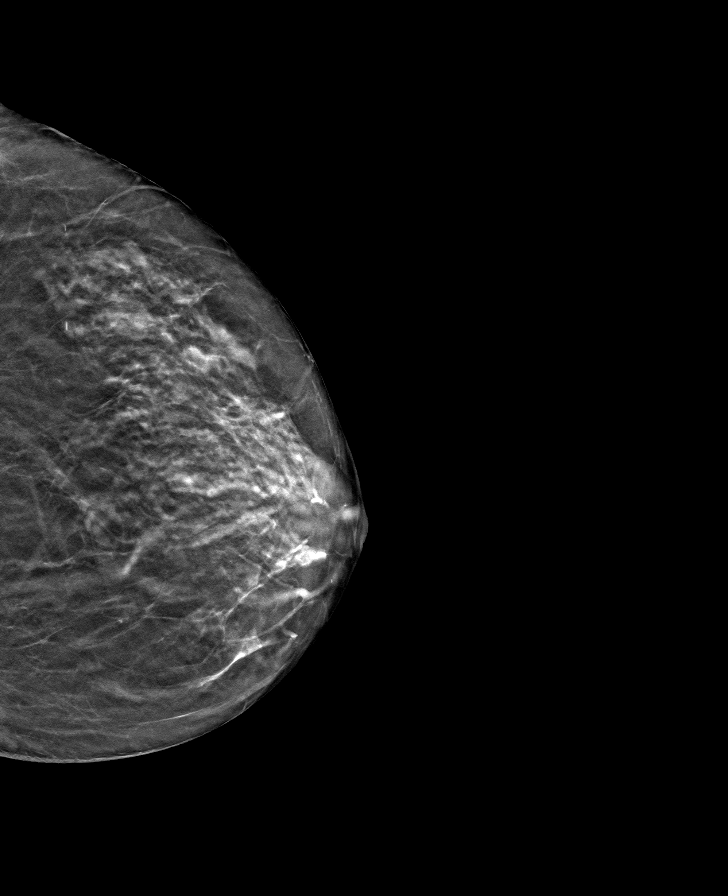

[8 of 24 positions shown; findings below may reference images not displayed]

ACR Breast Density Category c: The breast tissue is heterogeneously
dense, which may obscure small masses.
FINDINGS: There are no findings suspicious for malignancy.
IMPRESSION: No mammographic evidence of malignancy. A result letter of this
screening mammogram will be mailed directly to the patient.

RECOMMENDATION:
Screening mammogram in one year. (Code:Q3-W-BC3)

BI-RADS CATEGORY  1: Negative.

## 2022-12-19 ENCOUNTER — Ambulatory Visit
Admission: RE | Admit: 2022-12-19 | Discharge: 2022-12-19 | Disposition: A | Discharge status: Home / Self Care | Payer: Medicare Other | Source: Ambulatory Visit | Attending: Specialist | Admitting: Specialist

## 2022-12-19 DIAGNOSIS — Z1231 Encounter for screening mammogram for malignant neoplasm of breast: Secondary | ICD-10-CM

## 2023-11-05 ENCOUNTER — Other Ambulatory Visit: Payer: Self-pay | Admitting: Specialist

## 2023-11-05 DIAGNOSIS — Z1231 Encounter for screening mammogram for malignant neoplasm of breast: Secondary | ICD-10-CM

## 2023-12-20 ENCOUNTER — Ambulatory Visit

## 2024-01-23 ENCOUNTER — Ambulatory Visit

## 2024-03-10 ENCOUNTER — Ambulatory Visit
Admission: RE | Admit: 2024-03-10 | Discharge: 2024-03-10 | Disposition: A | Discharge status: Home / Self Care | Source: Ambulatory Visit | Attending: Specialist | Admitting: Specialist

## 2024-03-10 DIAGNOSIS — Z1231 Encounter for screening mammogram for malignant neoplasm of breast: Secondary | ICD-10-CM

## 2024-03-13 ENCOUNTER — Other Ambulatory Visit: Payer: Self-pay | Admitting: Specialist

## 2024-03-13 DIAGNOSIS — R928 Other abnormal and inconclusive findings on diagnostic imaging of breast: Secondary | ICD-10-CM

## 2024-03-19 ENCOUNTER — Other Ambulatory Visit: Payer: Self-pay | Admitting: Specialist

## 2024-03-19 ENCOUNTER — Ambulatory Visit
Admission: RE | Admit: 2024-03-19 | Discharge: 2024-03-19 | Disposition: A | Discharge status: Home / Self Care | Source: Ambulatory Visit | Attending: Specialist | Admitting: Specialist

## 2024-03-19 DIAGNOSIS — R928 Other abnormal and inconclusive findings on diagnostic imaging of breast: Secondary | ICD-10-CM

## 2024-03-19 DIAGNOSIS — N6320 Unspecified lump in the left breast, unspecified quadrant: Secondary | ICD-10-CM

## 2024-03-21 ENCOUNTER — Ambulatory Visit
Admission: RE | Admit: 2024-03-21 | Discharge: 2024-03-21 | Disposition: A | Discharge status: Home / Self Care | Source: Ambulatory Visit | Attending: Specialist | Admitting: Specialist

## 2024-03-21 DIAGNOSIS — R928 Other abnormal and inconclusive findings on diagnostic imaging of breast: Secondary | ICD-10-CM

## 2024-03-21 DIAGNOSIS — N6320 Unspecified lump in the left breast, unspecified quadrant: Secondary | ICD-10-CM

## 2024-03-21 HISTORY — PX: BREAST BIOPSY: SHX20

## 2024-03-24 LAB — SURGICAL PATHOLOGY
